# Patient Record
Sex: Male | Born: 1940 | Race: White | Hispanic: No | Marital: Married | Smoking: Current some day smoker
Health system: Southern US, Community
[De-identification: ages and names within clinical notes are randomized; demographics above are authoritative.]

## PROBLEM LIST (undated history)

## (undated) DIAGNOSIS — I10 Essential (primary) hypertension: Secondary | ICD-10-CM

## (undated) DIAGNOSIS — E7800 Pure hypercholesterolemia, unspecified: Secondary | ICD-10-CM

## (undated) DIAGNOSIS — J302 Other seasonal allergic rhinitis: Secondary | ICD-10-CM

## (undated) HISTORY — PX: TONSILLECTOMY: SUR1361

## (undated) HISTORY — DX: Pure hypercholesterolemia, unspecified: E78.00

## (undated) HISTORY — DX: Essential (primary) hypertension: I10

## (undated) HISTORY — DX: Other seasonal allergic rhinitis: J30.2

---

## 2002-11-07 ENCOUNTER — Ambulatory Visit (HOSPITAL_COMMUNITY): Admission: RE | Admit: 2002-11-07 | Discharge: 2002-11-07 | Payer: Self-pay | Admitting: Neurosurgery

## 2003-10-19 HISTORY — PX: WRIST FRACTURE SURGERY: SHX121

## 2014-04-17 ENCOUNTER — Ambulatory Visit (HOSPITAL_COMMUNITY)
Admission: RE | Admit: 2014-04-17 | Discharge: 2014-04-17 | Disposition: A | Discharge status: Home / Self Care | Payer: Medicare Other | Source: Ambulatory Visit | Attending: Internal Medicine | Admitting: Internal Medicine

## 2014-04-17 ENCOUNTER — Encounter: Payer: Self-pay | Admitting: Internal Medicine

## 2014-04-17 ENCOUNTER — Telehealth: Payer: Self-pay | Admitting: Pulmonary Disease

## 2014-04-17 ENCOUNTER — Ambulatory Visit (INDEPENDENT_AMBULATORY_CARE_PROVIDER_SITE_OTHER): Payer: Medicare Other | Admitting: Internal Medicine

## 2014-04-17 VITALS — BP 88/60 | HR 99 | Temp 97.4°F | Ht 70.0 in | Wt 170.0 lb

## 2014-04-17 DIAGNOSIS — I319 Disease of pericardium, unspecified: Secondary | ICD-10-CM | POA: Insufficient documentation

## 2014-04-17 DIAGNOSIS — R0602 Shortness of breath: Secondary | ICD-10-CM

## 2014-04-17 DIAGNOSIS — R599 Enlarged lymph nodes, unspecified: Secondary | ICD-10-CM | POA: Insufficient documentation

## 2014-04-17 DIAGNOSIS — I82290 Acute embolism and thrombosis of other thoracic veins: Secondary | ICD-10-CM | POA: Insufficient documentation

## 2014-04-17 DIAGNOSIS — R918 Other nonspecific abnormal finding of lung field: Secondary | ICD-10-CM

## 2014-04-17 DIAGNOSIS — R222 Localized swelling, mass and lump, trunk: Secondary | ICD-10-CM

## 2014-04-17 DIAGNOSIS — R0609 Other forms of dyspnea: Secondary | ICD-10-CM

## 2014-04-17 DIAGNOSIS — D739 Disease of spleen, unspecified: Secondary | ICD-10-CM

## 2014-04-17 DIAGNOSIS — I959 Hypotension, unspecified: Secondary | ICD-10-CM

## 2014-04-17 DIAGNOSIS — J9819 Other pulmonary collapse: Secondary | ICD-10-CM | POA: Insufficient documentation

## 2014-04-17 DIAGNOSIS — R1110 Vomiting, unspecified: Secondary | ICD-10-CM

## 2014-04-17 DIAGNOSIS — E871 Hypo-osmolality and hyponatremia: Secondary | ICD-10-CM

## 2014-04-17 DIAGNOSIS — R0600 Dyspnea, unspecified: Secondary | ICD-10-CM

## 2014-04-17 DIAGNOSIS — I2510 Atherosclerotic heart disease of native coronary artery without angina pectoris: Secondary | ICD-10-CM

## 2014-04-17 DIAGNOSIS — C3490 Malignant neoplasm of unspecified part of unspecified bronchus or lung: Secondary | ICD-10-CM | POA: Insufficient documentation

## 2014-04-17 DIAGNOSIS — R0989 Other specified symptoms and signs involving the circulatory and respiratory systems: Secondary | ICD-10-CM

## 2014-04-17 LAB — CBC WITH DIFFERENTIAL/PLATELET
Basophils Absolute: 0 10*3/uL (ref 0.0–0.1)
Basophils Relative: 0 % (ref 0–1)
Eosinophils Absolute: 0 10*3/uL (ref 0.0–0.7)
Eosinophils Relative: 0 % (ref 0–5)
HEMATOCRIT: 36.7 % — AB (ref 39.0–52.0)
HEMOGLOBIN: 12.4 g/dL — AB (ref 13.0–17.0)
LYMPHS PCT: 6 % — AB (ref 12–46)
Lymphs Abs: 0.9 10*3/uL (ref 0.7–4.0)
MCH: 31.9 pg (ref 26.0–34.0)
MCHC: 33.8 g/dL (ref 30.0–36.0)
MCV: 94.3 fL (ref 78.0–100.0)
MONO ABS: 0.5 10*3/uL (ref 0.1–1.0)
MONOS PCT: 3 % (ref 3–12)
NEUTROS ABS: 14.9 10*3/uL — AB (ref 1.7–7.7)
Neutrophils Relative %: 91 % — ABNORMAL HIGH (ref 43–77)
Platelets: 483 10*3/uL — ABNORMAL HIGH (ref 150–400)
RBC: 3.89 MIL/uL — ABNORMAL LOW (ref 4.22–5.81)
RDW: 13.8 % (ref 11.5–15.5)
WBC: 16.3 10*3/uL — AB (ref 4.0–10.5)

## 2014-04-17 LAB — BASIC METABOLIC PANEL
ANION GAP: 21 — AB (ref 5–15)
BUN: 37 mg/dL — ABNORMAL HIGH (ref 6–23)
CO2: 19 meq/L (ref 19–32)
Calcium: 9.6 mg/dL (ref 8.4–10.5)
Chloride: 84 mEq/L — ABNORMAL LOW (ref 96–112)
Creatinine, Ser: 1.63 mg/dL — ABNORMAL HIGH (ref 0.50–1.35)
GFR calc Af Amer: 47 mL/min — ABNORMAL LOW (ref >90)
GFR calc non Af Amer: 40 mL/min — ABNORMAL LOW (ref >90)
Glucose, Bld: 201 mg/dL — ABNORMAL HIGH (ref 70–99)
Potassium: 5.4 mEq/L — ABNORMAL HIGH (ref 3.7–5.3)
SODIUM: 124 meq/L — AB (ref 137–147)

## 2014-04-17 LAB — POCT I-STAT CREATININE: CREATININE: 1.8 mg/dL — AB (ref 0.50–1.35)

## 2014-04-17 LAB — PRO B NATRIURETIC PEPTIDE: Pro B Natriuretic peptide (BNP): 1390 pg/mL — ABNORMAL HIGH (ref 0–125)

## 2014-04-17 MED ORDER — IOHEXOL 350 MG/ML SOLN
100.0000 mL | Freq: Once | INTRAVENOUS | Status: AC | PRN
  Administered 2014-04-17: 100 mL via INTRAVENOUS

## 2014-04-17 NOTE — Progress Notes (Signed)
Subjective:    Patient ID: Dillon Willis, male    DOB: 05/01/1941  MRN: [id number]  HPI  33 yowm smoker/ retired truck driver breathing downhill x April 2015 assoc with intermittent hemoptysis eval by Dr Nyra Capes and referred to pulmonary clinic 04/17/2014 with dx of lung mass   04/17/2014 [facility]st [facility]/ Sharice Harriss  Chief Complaint  Patient presents with  . Pulmonary Consult    Referred by Dr. Nyra Capes for lung mass. C/o dyspnea with little activty. Pt's BP 88/60, c/o mild dizziness.   since CT chest 04/12/14 even worse with  Gradual onset  sob to point of room to room and light headed standing while on acei and amlopidine.  No classically pleuritic cp or ex cp, did have low grade hemoptytis sev week prior to OV  But this resolved despite maint on asa one daily   No obvious other patterns in day to day or daytime variabilty or assoc subjective wheeze overt sinus or hb symptoms. No unusual exp hx or h/o childhood pna/ asthma or knowledge of premature birth.  Sleeping ok without nocturnal  or early am exacerbation  of respiratory  c/o's or need for noct saba. Also denies any obvious fluctuation of symptoms with weather or environmental changes or other aggravating or alleviating factors except as outlined above   Current Medications, Allergies, Complete Past Medical History, Past Surgical History, Family History, and Social History were reviewed in [employer] record.            Review of Systems  Constitutional: Negative for fever and unexpected weight change.  HENT: Positive for congestion, postnasal drip, rhinorrhea and sinus pressure. Negative for dental problem, ear pain, nosebleeds, sneezing, sore throat and trouble swallowing.   Eyes: Negative for redness and itching.  Respiratory: Positive for cough, chest tightness and shortness of breath. Negative for wheezing.   Cardiovascular: Positive for chest pain and palpitations. Negative for leg  swelling.  Gastrointestinal: Negative for nausea and vomiting.  Genitourinary: Negative for dysuria.  Musculoskeletal: Negative for joint swelling.  Skin: Negative for rash.  Neurological: Negative for headaches.  Hematological: Does not bruise/bleed easily.  Psychiatric/Behavioral: Negative for dysphoric mood. The patient is not nervous/anxious.        Objective:   Physical Exam  W/c bound elderly wm > state age  Wt Readings from Last 3 Encounters:  04/17/14 170 lb (77.111 kg)      HEENT: nl dentition, turbinates, and orophanx. Nl external ear canals without cough reflex   NECK :  without JVD/Nodes/TM/ nl carotid upstrokes bilaterally   LUNGS: no acc muscle use, clear to A and P bilaterally without cough on insp or exp maneuvers   CV:  RRR  no s3 or murmur or increase in P2, no edema   ABD:  soft and nontender with nl excursion in the supine position. No bruits or organomegaly, bowel sounds nl  MS:  warm without deformities, calf tenderness, cyanosis or clubbing  SKIN: warm and dry without lesions    NEURO:  alert, approp, no deficits    CTa 04/17/2014  1. There is no acute pulmonary embolism. 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 Venous return from the head and upper  thorax is via collaterals. This may have existed previously but was  not apparent due to the noncontrast nature of the previous study.  2. The pericardial effusion has increased and  is now moderate in  size with maximal thickness of 2.6 cm. The small bilateral pleural  effusions have increased.  3. There remains total right upper lobe collapse. There is  subsegmental atelectasis on the left.  4. There is bilateral adrenal enlargement    Recent Labs Lab 04/17/14 1715  NA 124*  K 5.4*  CL 84*  CO2 19  BUN 37*  CREATININE 1.80*  1.63*  GLUCOSE 201*    Recent Labs Lab 04/17/14 1715  HGB 12.4*  HCT  36.7*  WBC 16.3*  PLT 483*     Lab Results  Component Value Date   PROBNP 1390.0* 04/17/2014               Assessment & Plan:

## 2014-04-17 NOTE — Assessment & Plan Note (Signed)
Not really explained by CT in that the effusions are small, the pericardial process is moderate but clearly not tamponading, and the loss of the RUL should not cause any sob x across a room but I suppose the three together are adding up to most of his sob and unfortunately no single fix will help a lot.  I suppose the pericardial process is the most pressing so will consider admit for pericardiocentesis first: this may give a dx and also alleviate some of his symptoms and if not consider fob only as an inpt as the next step.

## 2014-04-17 NOTE — Telephone Encounter (Signed)
Spoke with Dr. Martinique about CT chest results:  Ct Angio Chest Pe W/cm &/or Wo Cm  04/17/2014    CLINICAL DATA:  Severe shortness of breath especially with exertion, hypotension, vomiting. The patient's creatinine is 1.8 and the GFR is 42.   EXAM: CT ANGIOGRAPHY CHEST WITH CONTRAST   TECHNIQUE: Multidetector CT imaging of the chest was performed using the standard protocol during bolus administration of intravenous contrast. Multiplanar CT image reconstructions and MIPs were obtained to evaluate the vascular anatomy.   CONTRAST:  183mL OMNIPAQUE IOHEXOL 350 MG/ML SOLN intravenously.   COMPARISON:  PA chest x-ray dated April 15, 2014 and noncontrast CT scan of chest dated April 12, 2014.   FINDINGS:  Again demonstrated is the large mass in the right paratracheal region extending into the right hilum. This is producing mass effect upon the superior vena cava, and there is thrombus within the superior vena cava with venous return from the right upper extremity coursing through chest wall collaterals and the azygos system to reach the inferior vena cava. No filling defects are demonstrated within the pulmonary arterial tree. The caliber of the thoracic aorta is normal. The contrast bolus does not allow assessment of the presence of a false lumen. The pericardial effusion has increased and is now moderate in size. The cardiac chambers are normal in size. There are coronary artery calcifications. There are enlarged AP window lymph nodes.  There remains total collapse of the right upper lobe. The right middle lobe exhibits at least 1 abnormal nodule which is been previously demonstrated. The right lower lobe is clear. There is subsegmental atelectasis in the left lower lobe. There are small bilateral pleural effusions which have increased in size since the previous study.  Within the upper abdomen there are splenic calcifications consistent with previous granulomatous infection. There is bilateral adrenal  enlargement. The observed portions of the liver are normal. No acute bony abnormalities are demonstrated.  Review of the MIP images confirms the above findings.   IMPRESSION:  1. There is no acute pulmonary embolism. However, there is thrombus within the superior vena cava secondary to mass effect from the large right paratracheal mass. Venous return from the head and upper thorax is via collaterals. This may have existed previously but was not apparent due to the noncontrast nature of the previous study.  2. The pericardial effusion has increased and is now moderate in size with maximal thickness of 2.6 cm. The small bilateral pleural effusions have increased.  3. There remains total right upper lobe collapse. There is subsegmental atelectasis on the left. 4. There is bilateral adrenal enlargement.  5. These results were called by telephone at the time of interpretation on 04/17/2014 at 6:18 PM to Dr. Halford Chessman, who verbally acknowledged these results.    Electronically Signed   By: David  Martinique   On: 04/17/2014 18:21    Results d/w Dr. Melvyn Novas who will inform pt.

## 2014-04-17 NOTE — Assessment & Plan Note (Addendum)
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 Admit 7/2 for pericardiocentesis if cards agrees

## 2014-04-17 NOTE — Assessment & Plan Note (Signed)
Probably mulifactorial with poor po intake, hbp meds, ? Adrenal insuff related to tumor replacement  Will push fluids, stop hbp meds, consider admit 05/07/2014

## 2014-04-17 NOTE — Patient Instructions (Addendum)
Stop benazapril   Please see patient coordinator before you leave today  to schedule CTangiogram today and we will call you with results   Please remember to go to the lab   department downstairs for your tests - we will call you with the results when they are available.

## 2014-04-17 NOTE — Assessment & Plan Note (Addendum)
Assoc with low bp and elevated k so rx check cortisol level am 7/2 and rx  Hydrocortisone empirically

## 2014-04-18 ENCOUNTER — Inpatient Hospital Stay (HOSPITAL_COMMUNITY)
Admission: AD | Admit: 2014-04-18 | Discharge: 2014-05-18 | DRG: 163 | Disposition: E | Payer: Medicare Other | Source: Ambulatory Visit | Attending: Emergency Medicine | Admitting: Emergency Medicine

## 2014-04-18 ENCOUNTER — Inpatient Hospital Stay (HOSPITAL_COMMUNITY): Payer: Medicare Other

## 2014-04-18 ENCOUNTER — Encounter (HOSPITAL_COMMUNITY): Payer: Self-pay

## 2014-04-18 ENCOUNTER — Encounter (HOSPITAL_COMMUNITY): Admission: AD | Disposition: E | Payer: Self-pay | Source: Ambulatory Visit | Attending: Pulmonary Disease

## 2014-04-18 ENCOUNTER — Encounter (HOSPITAL_COMMUNITY): Payer: Medicare Other | Admitting: Certified Registered"

## 2014-04-18 ENCOUNTER — Inpatient Hospital Stay (HOSPITAL_COMMUNITY): Payer: Medicare Other | Admitting: Certified Registered"

## 2014-04-18 DIAGNOSIS — A419 Sepsis, unspecified organism: Secondary | ICD-10-CM | POA: Diagnosis present

## 2014-04-18 DIAGNOSIS — I10 Essential (primary) hypertension: Secondary | ICD-10-CM | POA: Diagnosis present

## 2014-04-18 DIAGNOSIS — R6521 Severe sepsis with septic shock: Secondary | ICD-10-CM

## 2014-04-18 DIAGNOSIS — I871 Compression of vein: Secondary | ICD-10-CM | POA: Diagnosis present

## 2014-04-18 DIAGNOSIS — R0600 Dyspnea, unspecified: Secondary | ICD-10-CM

## 2014-04-18 DIAGNOSIS — R5383 Other fatigue: Secondary | ICD-10-CM

## 2014-04-18 DIAGNOSIS — C3490 Malignant neoplasm of unspecified part of unspecified bronchus or lung: Secondary | ICD-10-CM | POA: Diagnosis present

## 2014-04-18 DIAGNOSIS — R7309 Other abnormal glucose: Secondary | ICD-10-CM | POA: Diagnosis present

## 2014-04-18 DIAGNOSIS — E43 Unspecified severe protein-calorie malnutrition: Secondary | ICD-10-CM | POA: Diagnosis present

## 2014-04-18 DIAGNOSIS — N179 Acute kidney failure, unspecified: Secondary | ICD-10-CM | POA: Diagnosis present

## 2014-04-18 DIAGNOSIS — R000 Tachycardia, unspecified: Secondary | ICD-10-CM | POA: Diagnosis present

## 2014-04-18 DIAGNOSIS — J189 Pneumonia, unspecified organism: Secondary | ICD-10-CM | POA: Diagnosis present

## 2014-04-18 DIAGNOSIS — J9600 Acute respiratory failure, unspecified whether with hypoxia or hypercapnia: Secondary | ICD-10-CM | POA: Diagnosis not present

## 2014-04-18 DIAGNOSIS — Z800 Family history of malignant neoplasm of digestive organs: Secondary | ICD-10-CM

## 2014-04-18 DIAGNOSIS — I3139 Other pericardial effusion (noninflammatory): Secondary | ICD-10-CM | POA: Diagnosis present

## 2014-04-18 DIAGNOSIS — R0609 Other forms of dyspnea: Secondary | ICD-10-CM

## 2014-04-18 DIAGNOSIS — E7800 Pure hypercholesterolemia, unspecified: Secondary | ICD-10-CM | POA: Diagnosis present

## 2014-04-18 DIAGNOSIS — R570 Cardiogenic shock: Secondary | ICD-10-CM | POA: Diagnosis present

## 2014-04-18 DIAGNOSIS — E871 Hypo-osmolality and hyponatremia: Secondary | ICD-10-CM | POA: Diagnosis present

## 2014-04-18 DIAGNOSIS — I4892 Unspecified atrial flutter: Secondary | ICD-10-CM | POA: Diagnosis present

## 2014-04-18 DIAGNOSIS — D72829 Elevated white blood cell count, unspecified: Secondary | ICD-10-CM | POA: Diagnosis present

## 2014-04-18 DIAGNOSIS — I4891 Unspecified atrial fibrillation: Secondary | ICD-10-CM | POA: Diagnosis present

## 2014-04-18 DIAGNOSIS — C7949 Secondary malignant neoplasm of other parts of nervous system: Secondary | ICD-10-CM

## 2014-04-18 DIAGNOSIS — Z87891 Personal history of nicotine dependence: Secondary | ICD-10-CM | POA: Diagnosis not present

## 2014-04-18 DIAGNOSIS — E875 Hyperkalemia: Secondary | ICD-10-CM | POA: Diagnosis present

## 2014-04-18 DIAGNOSIS — C3410 Malignant neoplasm of upper lobe, unspecified bronchus or lung: Secondary | ICD-10-CM

## 2014-04-18 DIAGNOSIS — I959 Hypotension, unspecified: Secondary | ICD-10-CM

## 2014-04-18 DIAGNOSIS — R0989 Other specified symptoms and signs involving the circulatory and respiratory systems: Secondary | ICD-10-CM

## 2014-04-18 DIAGNOSIS — C7931 Secondary malignant neoplasm of brain: Secondary | ICD-10-CM | POA: Diagnosis present

## 2014-04-18 DIAGNOSIS — C7970 Secondary malignant neoplasm of unspecified adrenal gland: Secondary | ICD-10-CM | POA: Diagnosis present

## 2014-04-18 DIAGNOSIS — J3489 Other specified disorders of nose and nasal sinuses: Secondary | ICD-10-CM | POA: Diagnosis present

## 2014-04-18 DIAGNOSIS — R222 Localized swelling, mass and lump, trunk: Secondary | ICD-10-CM

## 2014-04-18 DIAGNOSIS — Z79899 Other long term (current) drug therapy: Secondary | ICD-10-CM

## 2014-04-18 DIAGNOSIS — I82290 Acute embolism and thrombosis of other thoracic veins: Secondary | ICD-10-CM | POA: Diagnosis present

## 2014-04-18 DIAGNOSIS — R5381 Other malaise: Secondary | ICD-10-CM | POA: Diagnosis present

## 2014-04-18 DIAGNOSIS — R34 Anuria and oliguria: Secondary | ICD-10-CM | POA: Diagnosis present

## 2014-04-18 DIAGNOSIS — J9819 Other pulmonary collapse: Secondary | ICD-10-CM | POA: Diagnosis present

## 2014-04-18 DIAGNOSIS — I314 Cardiac tamponade: Secondary | ICD-10-CM | POA: Diagnosis present

## 2014-04-18 DIAGNOSIS — I480 Paroxysmal atrial fibrillation: Secondary | ICD-10-CM

## 2014-04-18 DIAGNOSIS — Z8249 Family history of ischemic heart disease and other diseases of the circulatory system: Secondary | ICD-10-CM

## 2014-04-18 DIAGNOSIS — I319 Disease of pericardium, unspecified: Secondary | ICD-10-CM | POA: Diagnosis present

## 2014-04-18 DIAGNOSIS — R918 Other nonspecific abnormal finding of lung field: Secondary | ICD-10-CM

## 2014-04-18 DIAGNOSIS — R6520 Severe sepsis without septic shock: Secondary | ICD-10-CM | POA: Diagnosis present

## 2014-04-18 DIAGNOSIS — I4819 Other persistent atrial fibrillation: Secondary | ICD-10-CM

## 2014-04-18 DIAGNOSIS — R0602 Shortness of breath: Secondary | ICD-10-CM | POA: Diagnosis present

## 2014-04-18 DIAGNOSIS — C3491 Malignant neoplasm of unspecified part of right bronchus or lung: Secondary | ICD-10-CM

## 2014-04-18 DIAGNOSIS — I313 Pericardial effusion (noninflammatory): Secondary | ICD-10-CM

## 2014-04-18 HISTORY — PX: INTRAOPERATIVE TRANSESOPHAGEAL ECHOCARDIOGRAM: SHX5062

## 2014-04-18 HISTORY — PX: SUBXYPHOID PERICARDIAL WINDOW: SHX5075

## 2014-04-18 HISTORY — PX: VIDEO BRONCHOSCOPY: SHX5072

## 2014-04-18 LAB — CBC WITH DIFFERENTIAL/PLATELET
BASOS PCT: 0 % (ref 0–1)
Basophils Absolute: 0 10*3/uL (ref 0.0–0.1)
EOS PCT: 0 % (ref 0–5)
Eosinophils Absolute: 0 10*3/uL (ref 0.0–0.7)
HEMATOCRIT: 37 % — AB (ref 39.0–52.0)
HEMOGLOBIN: 12.9 g/dL — AB (ref 13.0–17.0)
LYMPHS ABS: 1.6 10*3/uL (ref 0.7–4.0)
Lymphocytes Relative: 7 % — ABNORMAL LOW (ref 12–46)
MCH: 32.7 pg (ref 26.0–34.0)
MCHC: 34.9 g/dL (ref 30.0–36.0)
MCV: 93.7 fL (ref 78.0–100.0)
MONO ABS: 0.9 10*3/uL (ref 0.1–1.0)
Monocytes Relative: 4 % (ref 3–12)
NEUTROS ABS: 20 10*3/uL — AB (ref 1.7–7.7)
Neutrophils Relative %: 89 % — ABNORMAL HIGH (ref 43–77)
Platelets: 456 10*3/uL — ABNORMAL HIGH (ref 150–400)
RBC: 3.95 MIL/uL — AB (ref 4.22–5.81)
RDW: 13.8 % (ref 11.5–15.5)
WBC: 22.5 10*3/uL — ABNORMAL HIGH (ref 4.0–10.5)

## 2014-04-18 LAB — GLUCOSE, CAPILLARY
GLUCOSE-CAPILLARY: 152 mg/dL — AB (ref 70–99)
Glucose-Capillary: 150 mg/dL — ABNORMAL HIGH (ref 70–99)
Glucose-Capillary: 162 mg/dL — ABNORMAL HIGH (ref 70–99)
Glucose-Capillary: 148 mg/dL — ABNORMAL HIGH (ref 70–99)

## 2014-04-18 LAB — COMPREHENSIVE METABOLIC PANEL
ALT: 15 U/L (ref 0–53)
AST: 19 U/L (ref 0–37)
Albumin: 3.4 g/dL — ABNORMAL LOW (ref 3.5–5.2)
Alkaline Phosphatase: 104 U/L (ref 39–117)
Anion gap: 21 — ABNORMAL HIGH (ref 5–15)
BUN: 43 mg/dL — ABNORMAL HIGH (ref 6–23)
CALCIUM: 9.9 mg/dL (ref 8.4–10.5)
CO2: 17 meq/L — AB (ref 19–32)
Chloride: 84 mEq/L — ABNORMAL LOW (ref 96–112)
Creatinine, Ser: 1.38 mg/dL — ABNORMAL HIGH (ref 0.50–1.35)
GFR, EST AFRICAN AMERICAN: 57 mL/min — AB (ref >90)
GFR, EST NON AFRICAN AMERICAN: 50 mL/min — AB (ref >90)
GLUCOSE: 132 mg/dL — AB (ref 70–99)
Potassium: 5 mEq/L (ref 3.7–5.3)
Sodium: 122 mEq/L — ABNORMAL LOW (ref 137–147)
Total Bilirubin: 0.7 mg/dL (ref 0.3–1.2)
Total Protein: 7.1 g/dL (ref 6.0–8.3)

## 2014-04-18 LAB — MRSA PCR SCREENING: MRSA by PCR: NEGATIVE

## 2014-04-18 LAB — MAGNESIUM: Magnesium: 2.6 mg/dL — ABNORMAL HIGH (ref 1.5–2.5)

## 2014-04-18 LAB — PRO B NATRIURETIC PEPTIDE: Pro B Natriuretic peptide (BNP): 1408 pg/mL — ABNORMAL HIGH (ref 0–125)

## 2014-04-18 LAB — TSH: TSH: 4.42 u[IU]/mL (ref 0.350–4.500)

## 2014-04-18 LAB — PHOSPHORUS: Phosphorus: 4.7 mg/dL — ABNORMAL HIGH (ref 2.3–4.6)

## 2014-04-18 SURGERY — BRONCHOSCOPY, VIDEO-ASSISTED
Anesthesia: General

## 2014-04-18 SURGERY — VIDEO BRONCHOSCOPY WITHOUT FLUORO
Anesthesia: Moderate Sedation | Laterality: Bilateral

## 2014-04-18 MED ORDER — PHENYLEPHRINE HCL 0.25 % NA SOLN
NASAL | Status: DC | PRN
  Administered 2014-04-18: 2 via NASAL

## 2014-04-18 MED ORDER — HYDROMORPHONE HCL PF 1 MG/ML IJ SOLN
0.2500 mg | INTRAMUSCULAR | Status: DC | PRN
Start: 2014-04-18 — End: 2014-04-18
  Administered 2014-04-18 (×2): 0.5 mg via INTRAVENOUS

## 2014-04-18 MED ORDER — PROPRANOLOL HCL 1 MG/ML IV SOLN
1.0000 mg | Freq: Once | INTRAVENOUS | Status: AC
  Administered 2014-04-18 (×2): 0.25 mg via INTRAVENOUS

## 2014-04-18 MED ORDER — HEPARIN SODIUM (PORCINE) 5000 UNIT/ML IJ SOLN
5000.0000 [IU] | Freq: Three times a day (TID) | INTRAMUSCULAR | Status: DC
  Filled 2014-04-18 (×2): qty 1

## 2014-04-18 MED ORDER — PHENYLEPHRINE HCL 10 MG/ML IJ SOLN
10.0000 mg | INTRAVENOUS | Status: DC | PRN
  Administered 2014-04-18: 40 ug/min via INTRAVENOUS

## 2014-04-18 MED ORDER — MIDAZOLAM HCL 10 MG/2ML IJ SOLN: 1.0000 mg | Freq: Once | INTRAMUSCULAR | Status: DC

## 2014-04-18 MED ORDER — MEPERIDINE HCL 100 MG/ML IJ SOLN: 100.0000 mg | Freq: Once | INTRAMUSCULAR | Status: DC

## 2014-04-18 MED ORDER — 0.9 % SODIUM CHLORIDE (POUR BTL) OPTIME
TOPICAL | Status: DC | PRN
  Administered 2014-04-18: 2000 mL

## 2014-04-18 MED ORDER — NEOSTIGMINE METHYLSULFATE 10 MG/10ML IV SOLN
INTRAVENOUS | Status: DC | PRN
  Administered 2014-04-18: 3 mg via INTRAVENOUS

## 2014-04-18 MED ORDER — ETOMIDATE 2 MG/ML IV SOLN
INTRAVENOUS | Status: DC | PRN
  Administered 2014-04-18: 4 mg via INTRAVENOUS
  Administered 2014-04-18: 16 mg via INTRAVENOUS

## 2014-04-18 MED ORDER — HYDROCORTISONE NA SUCCINATE PF 100 MG IJ SOLR
100.0000 mg | Freq: Four times a day (QID) | INTRAMUSCULAR | Status: DC
  Administered 2014-04-18: 100 mg via INTRAVENOUS
  Filled 2014-04-18 (×3): qty 2

## 2014-04-18 MED ORDER — AMIODARONE HCL IN DEXTROSE 360-4.14 MG/200ML-% IV SOLN
30.0000 mg/h | INTRAVENOUS | Status: DC
  Filled 2014-04-18 (×2): qty 200

## 2014-04-18 MED ORDER — PHENYLEPHRINE HCL 0.25 % NA SOLN
1.0000 | Freq: Four times a day (QID) | NASAL | Status: DC | PRN
Start: 2014-04-18 — End: 2014-04-18

## 2014-04-18 MED ORDER — PANTOPRAZOLE SODIUM 40 MG PO TBEC
40.0000 mg | DELAYED_RELEASE_TABLET | Freq: Every day | ORAL | Status: DC
  Administered 2014-04-19: 40 mg via ORAL
  Filled 2014-04-18: qty 1

## 2014-04-18 MED ORDER — ACETAMINOPHEN 160 MG/5ML PO SOLN
1000.0000 mg | Freq: Four times a day (QID) | ORAL | Status: AC
  Filled 2014-04-18: qty 40

## 2014-04-18 MED ORDER — SENNOSIDES-DOCUSATE SODIUM 8.6-50 MG PO TABS
1.0000 | ORAL_TABLET | Freq: Every day | ORAL | Status: DC
  Administered 2014-04-19 – 2014-04-27 (×4): 1 via ORAL
  Filled 2014-04-18 (×12): qty 1

## 2014-04-18 MED ORDER — POTASSIUM CHLORIDE 10 MEQ/50ML IV SOLN
10.0000 meq | Freq: Every day | INTRAVENOUS | Status: DC | PRN
  Filled 2014-04-18: qty 50

## 2014-04-18 MED ORDER — INSULIN ASPART 100 UNIT/ML ~~LOC~~ SOLN
1.0000 [IU] | SUBCUTANEOUS | Status: DC
  Administered 2014-04-18 (×2): 1 [IU] via SUBCUTANEOUS
  Administered 2014-04-19 (×2): 2 [IU] via SUBCUTANEOUS
  Administered 2014-04-19 – 2014-04-20 (×4): 1 [IU] via SUBCUTANEOUS

## 2014-04-18 MED ORDER — LIDOCAINE HCL 2 % EX GEL: 1.0000 "application " | Freq: Once | CUTANEOUS | Status: DC

## 2014-04-18 MED ORDER — DILTIAZEM HCL 100 MG IV SOLR
5.0000 mg/h | INTRAVENOUS | Status: DC
  Administered 2014-04-18: 5 mg/h via INTRAVENOUS
  Filled 2014-04-18: qty 100

## 2014-04-18 MED ORDER — DEXTROSE-NACL 5-0.9 % IV SOLN
INTRAVENOUS | Status: DC
  Administered 2014-04-18 – 2014-04-21 (×3): via INTRAVENOUS
  Administered 2014-04-27: 50 mL via INTRAVENOUS
  Administered 2014-04-28: 1000 mL via INTRAVENOUS

## 2014-04-18 MED ORDER — AMIODARONE HCL IN DEXTROSE 360-4.14 MG/200ML-% IV SOLN
60.0000 mg/h | INTRAVENOUS | Status: AC
  Administered 2014-04-18 – 2014-04-19 (×2): 60 mg/h via INTRAVENOUS
  Filled 2014-04-18 (×2): qty 200

## 2014-04-18 MED ORDER — FENTANYL CITRATE 0.05 MG/ML IJ SOLN
25.0000 ug | INTRAMUSCULAR | Status: DC | PRN
  Administered 2014-04-20: 25 ug via INTRAVENOUS
  Administered 2014-04-29 (×2): 50 ug via INTRAVENOUS
  Filled 2014-04-18 (×3): qty 2

## 2014-04-18 MED ORDER — GLYCOPYRROLATE 0.2 MG/ML IJ SOLN
INTRAMUSCULAR | Status: DC | PRN
  Administered 2014-04-18: 0.4 mg via INTRAVENOUS

## 2014-04-18 MED ORDER — HYDROMORPHONE HCL PF 1 MG/ML IJ SOLN
INTRAMUSCULAR | Status: AC
  Filled 2014-04-18: qty 1

## 2014-04-18 MED ORDER — PANTOPRAZOLE SODIUM 40 MG IV SOLR
40.0000 mg | Freq: Every day | INTRAVENOUS | Status: DC
  Filled 2014-04-18: qty 40

## 2014-04-18 MED ORDER — BISACODYL 5 MG PO TBEC
10.0000 mg | DELAYED_RELEASE_TABLET | Freq: Every day | ORAL | Status: DC
Start: 2014-04-18 — End: 2014-04-29
  Administered 2014-04-19 – 2014-04-26 (×5): 10 mg via ORAL
  Filled 2014-04-18 (×7): qty 2

## 2014-04-18 MED ORDER — ACETAMINOPHEN 500 MG PO TABS
1000.0000 mg | ORAL_TABLET | Freq: Four times a day (QID) | ORAL | Status: AC
  Administered 2014-04-19 – 2014-04-23 (×10): 1000 mg via ORAL
  Filled 2014-04-18 (×17): qty 2

## 2014-04-18 MED ORDER — OXYCODONE HCL 5 MG PO TABS
5.0000 mg | ORAL_TABLET | ORAL | Status: DC | PRN
  Administered 2014-04-28: 10 mg via ORAL
  Filled 2014-04-18: qty 2

## 2014-04-18 MED ORDER — PROMETHAZINE HCL 25 MG/ML IJ SOLN: 6.2500 mg | INTRAMUSCULAR | Status: DC | PRN

## 2014-04-18 MED ORDER — SIMVASTATIN 20 MG PO TABS
20.0000 mg | ORAL_TABLET | Freq: Every evening | ORAL | Status: DC
  Administered 2014-04-19 – 2014-04-20 (×2): 20 mg via ORAL
  Filled 2014-04-18 (×4): qty 1

## 2014-04-18 MED ORDER — OXYCODONE HCL 5 MG PO TABS: 5.0000 mg | ORAL_TABLET | Freq: Once | ORAL | Status: DC | PRN

## 2014-04-18 MED ORDER — PROPRANOLOL HCL 1 MG/ML IV SOLN
INTRAVENOUS | Status: AC
  Administered 2014-04-18: 0.25 mg via INTRAVENOUS
  Filled 2014-04-18: qty 1

## 2014-04-18 MED ORDER — LIDOCAINE HCL 2 % EX GEL
CUTANEOUS | Status: DC | PRN
  Administered 2014-04-18: 1

## 2014-04-18 MED ORDER — FENTANYL CITRATE 0.05 MG/ML IJ SOLN
INTRAMUSCULAR | Status: DC | PRN
  Administered 2014-04-18: 150 ug via INTRAVENOUS

## 2014-04-18 MED ORDER — OXYCODONE HCL 5 MG/5ML PO SOLN: 5.0000 mg | Freq: Once | ORAL | Status: DC | PRN

## 2014-04-18 MED ORDER — ONDANSETRON HCL 4 MG/2ML IJ SOLN: 4.0000 mg | Freq: Four times a day (QID) | INTRAMUSCULAR | Status: DC | PRN

## 2014-04-18 MED ORDER — TRAMADOL HCL 50 MG PO TABS
50.0000 mg | ORAL_TABLET | Freq: Four times a day (QID) | ORAL | Status: DC | PRN
  Administered 2014-04-24 – 2014-04-25 (×2): 50 mg via ORAL
  Filled 2014-04-18 (×2): qty 1

## 2014-04-18 MED ORDER — MONTELUKAST SODIUM 10 MG PO TABS
10.0000 mg | ORAL_TABLET | Freq: Every morning | ORAL | Status: DC
  Administered 2014-04-19 – 2014-04-29 (×11): 10 mg via ORAL
  Filled 2014-04-18 (×12): qty 1

## 2014-04-18 MED ORDER — DEXTROSE 5 % IV SOLN
1.5000 g | INTRAVENOUS | Status: AC
  Administered 2014-04-18: 1.5 g via INTRAVENOUS
  Filled 2014-04-18: qty 1.5

## 2014-04-18 MED ORDER — ROCURONIUM BROMIDE 100 MG/10ML IV SOLN
INTRAVENOUS | Status: DC | PRN
  Administered 2014-04-18: 10 mg via INTRAVENOUS
  Administered 2014-04-18: 40 mg via INTRAVENOUS

## 2014-04-18 MED ORDER — LACTATED RINGERS IV SOLN
INTRAVENOUS | Status: DC | PRN
  Administered 2014-04-18: 16:00:00 via INTRAVENOUS

## 2014-04-18 MED ORDER — ONDANSETRON HCL 4 MG/2ML IJ SOLN
INTRAMUSCULAR | Status: DC | PRN
  Administered 2014-04-18: 4 mg via INTRAVENOUS

## 2014-04-18 MED ORDER — LIDOCAINE HCL (CARDIAC) 20 MG/ML IV SOLN
INTRAVENOUS | Status: DC | PRN
  Administered 2014-04-18: 100 mg via INTRAVENOUS

## 2014-04-18 MED ORDER — SODIUM CHLORIDE 0.9 % IV SOLN: 250.0000 mL | INTRAVENOUS | Status: DC | PRN

## 2014-04-18 MED ORDER — SODIUM CHLORIDE 0.9 % IV SOLN
INTRAVENOUS | Status: DC
  Administered 2014-04-18 (×2): via INTRAVENOUS

## 2014-04-18 MED ORDER — MIDAZOLAM HCL 5 MG/5ML IJ SOLN
INTRAMUSCULAR | Status: DC | PRN
  Administered 2014-04-18: 1 mg via INTRAVENOUS

## 2014-04-18 MED ORDER — DEXTROSE 5 % IV SOLN
1.5000 g | Freq: Three times a day (TID) | INTRAVENOUS | Status: AC
  Administered 2014-04-19 (×2): 1.5 g via INTRAVENOUS
  Filled 2014-04-18 (×2): qty 1.5

## 2014-04-18 MED ORDER — HYDROCORTISONE NA SUCCINATE PF 100 MG IJ SOLR
80.0000 mg | Freq: Two times a day (BID) | INTRAMUSCULAR | Status: DC
  Administered 2014-04-18 – 2014-04-19 (×2): 80 mg via INTRAVENOUS
  Filled 2014-04-18 (×3): qty 1.6

## 2014-04-18 SURGICAL SUPPLY — 48 items
ATTRACTOMAT 16X20 MAGNETIC DRP (DRAPES) ×3 IMPLANT
BENZOIN TINCTURE PRP APPL 2/3 (GAUZE/BANDAGES/DRESSINGS) ×3 IMPLANT
CANISTER SUCTION 2500CC (MISCELLANEOUS) ×3 IMPLANT
CATH THORACIC 28FR (CATHETERS) IMPLANT
CATH THORACIC 28FR RT ANG (CATHETERS) IMPLANT
CATH THORACIC 36FR (CATHETERS) IMPLANT
CATH THORACIC 36FR RT ANG (CATHETERS) IMPLANT
CONT SPEC 4OZ CLIKSEAL STRL BL (MISCELLANEOUS) IMPLANT
COVER SURGICAL LIGHT HANDLE (MISCELLANEOUS) ×6 IMPLANT
DERMABOND ADVANCED (GAUZE/BANDAGES/DRESSINGS) ×1
DERMABOND ADVANCED .7 DNX12 (GAUZE/BANDAGES/DRESSINGS) ×2 IMPLANT
DRAIN CHANNEL 28F RND 3/8 FF (WOUND CARE) ×3 IMPLANT
DRAPE LAPAROSCOPIC ABDOMINAL (DRAPES) ×3 IMPLANT
DRAPE PROXIMA HALF (DRAPES) ×3 IMPLANT
ELECT REM PT RETURN 9FT ADLT (ELECTROSURGICAL) ×3
ELECTRODE REM PT RTRN 9FT ADLT (ELECTROSURGICAL) ×2 IMPLANT
FORCEPS RADIAL JAW LRG 4 PULM (INSTRUMENTS) ×2 IMPLANT
GLOVE BIO SURGEON STRL SZ7.5 (GLOVE) ×6 IMPLANT
GLOVE BIOGEL PI IND STRL 7.0 (GLOVE) ×2 IMPLANT
GLOVE BIOGEL PI INDICATOR 7.0 (GLOVE) ×1
HEMOSTAT POWDER SURGIFOAM 1G (HEMOSTASIS) IMPLANT
KIT BASIN OR (CUSTOM PROCEDURE TRAY) ×3 IMPLANT
KIT ROOM TURNOVER OR (KITS) ×3 IMPLANT
NS IRRIG 1000ML POUR BTL (IV SOLUTION) ×3 IMPLANT
OIL SILICONE PENTAX (PARTS (SERVICE/REPAIRS)) ×3 IMPLANT
PACK CHEST (CUSTOM PROCEDURE TRAY) ×3 IMPLANT
PAD ARMBOARD 7.5X6 YLW CONV (MISCELLANEOUS) ×6 IMPLANT
PAD ELECT DEFIB RADIOL ZOLL (MISCELLANEOUS) ×3 IMPLANT
RADIAL JAW LRG 4 PULMONARY (INSTRUMENTS) ×1
SPONGE GAUZE 4X4 12PLY (GAUZE/BANDAGES/DRESSINGS) ×3 IMPLANT
STRIP CLOSURE SKIN 1/2X4 (GAUZE/BANDAGES/DRESSINGS) ×3 IMPLANT
SUT SILK 2 0 SH CR/8 (SUTURE) ×3 IMPLANT
SUT VIC AB 1 CTX 18 (SUTURE) ×3 IMPLANT
SUT VIC AB 1 CTX 36 (SUTURE) ×1
SUT VIC AB 1 CTX36XBRD ANBCTR (SUTURE) ×2 IMPLANT
SUT VIC AB 3-0 X1 27 (SUTURE) ×3 IMPLANT
SWAB COLLECTION DEVICE MRSA (MISCELLANEOUS) ×3 IMPLANT
SYR 50ML SLIP (SYRINGE) ×3 IMPLANT
SYRINGE 10CC LL (SYRINGE) IMPLANT
SYSTEM SAHARA CHEST DRAIN ATS (WOUND CARE) ×3 IMPLANT
TAPE CLOTH SURG 4X10 WHT LF (GAUZE/BANDAGES/DRESSINGS) ×3 IMPLANT
TOWEL OR 17X24 6PK STRL BLUE (TOWEL DISPOSABLE) ×3 IMPLANT
TOWEL OR 17X26 10 PK STRL BLUE (TOWEL DISPOSABLE) ×9 IMPLANT
TRAP SPECIMEN MUCOUS 40CC (MISCELLANEOUS) ×9 IMPLANT
TRAY FOLEY CATH 14FRSI W/METER (CATHETERS) ×3 IMPLANT
TRAY FOLEY IC TEMP SENS 14FR (CATHETERS) ×3 IMPLANT
TUBE ANAEROBIC SPECIMEN COL (MISCELLANEOUS) ×3 IMPLANT
WATER STERILE IRR 1000ML POUR (IV SOLUTION) ×6 IMPLANT

## 2014-04-18 NOTE — Anesthesia Procedure Notes (Addendum)
Procedure Name: Intubation Date/Time: 04/25/2014 4:42 PM Performed by: Melina Copa, Nadir Vasques R Pre-anesthesia Checklist: Patient identified, Emergency Drugs available, Suction available, Patient being monitored and Timeout performed Patient Re-evaluated:Patient Re-evaluated prior to inductionOxygen Delivery Method: Circle system utilized Preoxygenation: Pre-oxygenation with 100% oxygen Intubation Type: IV induction Ventilation: Mask ventilation without difficulty Laryngoscope Size: Mac and 4 Grade View: Grade II Tube type: Oral Tube size: 8.5 mm Number of attempts: 1 Airway Equipment and Method: Stylet Placement Confirmation: ETT inserted through vocal cords under direct vision,  positive ETCO2 and breath sounds checked- equal and bilateral Secured at: 22 cm Tube secured with: Tape Dental Injury: Teeth and Oropharynx as per pre-operative assessment     The patient was identified and consent obtained.  TO was performed, and full barrier precautions were used.  The skin was anesthetized with lidocaine.  Once the vein was located with the 22 ga., the wire was inserted into the vein. 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 Darcey Nora we decided not to proceed.  Earnest Bailey, MD

## 2014-04-18 NOTE — Anesthesia Preprocedure Evaluation (Addendum)
Anesthesia Evaluation  Patient identified by MRN, date of birth, ID band Patient awake    Reviewed: Allergy & Precautions, H&P , NPO status , Patient's Chart, lab work & pertinent test results  Airway Mallampati: II TM Distance: >3 FB Neck ROM: Limited    Dental  (+) Edentulous Upper, Dental Advisory Given   Pulmonary Current Smoker,  + rhonchi         Cardiovascular hypertension,     Neuro/Psych    GI/Hepatic negative GI ROS, Neg liver ROS,   Endo/Other  negative endocrine ROS  Renal/GU negative Renal ROS     Musculoskeletal   Abdominal   Peds  Hematology   Anesthesia Other Findings   Reproductive/Obstetrics                          Anesthesia Physical Anesthesia Plan  ASA: III  Anesthesia Plan: General   Post-op Pain Management:    Induction: Intravenous  Airway Management Planned: Oral ETT  Additional Equipment: Arterial line and CVP  Intra-op Plan:   Post-operative Plan: Extubation in OR  Informed Consent: I have reviewed the patients History and Physical, chart, labs and discussed the procedure including the risks, benefits and alternatives for the proposed anesthesia with the patient or authorized representative who has indicated his/her understanding and acceptance.   Dental advisory given  Plan Discussed with: CRNA, Anesthesiologist and Surgeon  Anesthesia Plan Comments:        Anesthesia Quick Evaluation

## 2014-04-18 NOTE — Progress Notes (Signed)
[facility]  Stat echo today shows tamponade physiology. Dillon Willis is comfortable right now, he had just made it to the bronch suite, but not administered any sedation. Currently his vital signs are stable and he is breathing comfortably.   However, we need to deal with the tamponade now.  Cardiothoracic surgery consulted Plan to transfer to Dillon Willis now Maintain NPO  Patient updated at length, son updated.  CC time by me 45 minutes.  [name], MD [facility] Pager: [phone] Cell: [phone] If no response, call [phone]

## 2014-04-18 NOTE — Progress Notes (Signed)
Brought pt down for bronchoscopy. Timeout performed & began prepping pt for procedure. 0.25% Neosynephrine sprayed twice in each nare & 2% lidocaine jelly 2 ml once down each nare. Dr Lake Bells came in & said procedure is canceled. Pt has fluid built up around his heart & needs emergency surgery. Pt will be transported back to ICU at this time.   Kathie Dike RRT

## 2014-04-18 NOTE — Anesthesia Postprocedure Evaluation (Signed)
  Anesthesia Post-op Note  Patient: Dillon Willis  Procedure(s) Performed: Procedure(s): INTRAOPERATIVE TRANSESOPHAGEAL ECHOCARDIOGRAM (N/A) VIDEO BRONCHOSCOPY SUBXYPHOID PERICARDIAL WINDOW (N/A)  Patient Location: PACU  Anesthesia Type:General  Level of Consciousness: awake and alert   Airway and Oxygen Therapy: Patient Spontanous Breathing  Post-op Pain: mild  Post-op Assessment: Post-op Vital signs reviewed, Patient's Cardiovascular Status Stable and Respiratory Function Stable  Post-op Vital Signs: stable  Last Vitals:  Filed Vitals:   05/05/2014 1815  BP: 148/86  Pulse: 131  Temp:   Resp: 28    Complications: No apparent anesthesia complications

## 2014-04-18 NOTE — Progress Notes (Signed)
Echocardiogram Echocardiogram Transesophageal has been performed.  Dillon Willis 05/08/2014, 5:10 PM

## 2014-04-18 NOTE — Progress Notes (Signed)
Echo Lab  2D Echocardiogram completed.  Chivon Lepage L Lacretia Tindall, RDCS 04/22/2014 10:50 AM

## 2014-04-18 NOTE — Progress Notes (Signed)
Day of Surgery Procedure(s) (LRB): INTRAOPERATIVE TRANSESOPHAGEAL ECHOCARDIOGRAM (N/A) VIDEO BRONCHOSCOPY SUBXYPHOID PERICARDIAL WINDOW (N/A) Subjective: Patient examined and CT , echocardiogram reviewed He has tamponade from prob lung cancer with RUL collapse and paratrecheal mass, SVC syndrome Thrombus in SVC- will start heparin later postop bilaterlal adrenal masses  Objective: Vital signs in last 24 hours: Temp:  [98.4 F (36.9 C)-98.6 F (37 C)] 98.6 F (37 C) (07/02 1200) Pulse Rate:  [93-102] 93 (07/02 1400) Cardiac Rhythm:  [-] Normal sinus rhythm (07/02 1100) Resp:  [16-26] 20 (07/02 1400) BP: (91-117)/(58-78) 103/76 mmHg (07/02 1400) SpO2:  [95 %-98 %] 95 % (07/02 1400) Weight:  [166 lb 7.2 oz (75.5 kg)] 166 lb 7.2 oz (75.5 kg) (07/02 1015)  Hemodynamic parameters for last 24 hours:   Tachy, low BP Intake/Output from previous day:   Intake/Output this shift: Total I/O In: 636.7 [I.V.:636.7] Out: -   Elderly inbed Neck veins enlarged Lungs clear Rapid HR w/o murmur Neuro intact Lab Results:  Recent Labs  04/17/14 1715 04/17/2014 1030  WBC 16.3* 22.5*  HGB 12.4* 12.9*  HCT 36.7* 37.0*  PLT 483* 456*   BMET:  Recent Labs  04/17/14 1715 04/28/2014 1030  NA 124* 122*  K 5.4* 5.0  CL 84* 84*  CO2 19 17*  GLUCOSE 201* 132*  BUN 37* 43*  CREATININE 1.80*  1.63* 1.38*  CALCIUM 9.6 9.9    PT/INR: No results found for this basename: LABPROT, INR,  in the last 72 hours ABG No results found for this basename: phart, pco2, po2, hco3, tco2, acidbasedef, o2sat   CBG (last 3)   Recent Labs  05/05/2014 1201  GLUCAP 150*    Assessment/Plan: S/P Procedure(s) (LRB): INTRAOPERATIVE TRANSESOPHAGEAL ECHOCARDIOGRAM (N/A) VIDEO BRONCHOSCOPY SUBXYPHOID PERICARDIAL WINDOW (N/A) Proceed with bronch, window Discussed procedure with patient and son- benefits and risks   LOS: 0 days    VAN TRIGT III,Jermeka Schlotterbeck 05/08/2014

## 2014-04-18 NOTE — Progress Notes (Signed)
HR after 3rd dose inderal 0.25mg  is now 110's 120's , sbp 106/69 by cuff / 122/62 aline. Notified dr Orene Desanctis, will d/c

## 2014-04-18 NOTE — Transfer of Care (Signed)
Immediate Anesthesia Transfer of Care Note  Patient: Dillon Willis  Procedure(s) Performed: Procedure(s): INTRAOPERATIVE TRANSESOPHAGEAL ECHOCARDIOGRAM (N/A) VIDEO BRONCHOSCOPY SUBXYPHOID PERICARDIAL WINDOW (N/A)  Patient Location: PACU  Anesthesia Type:General  Level of Consciousness: awake  Airway & Oxygen Therapy: Patient Spontanous Breathing and Patient connected to nasal cannula oxygen  Post-op Assessment: Report given to PACU RN, Post -op Vital signs reviewed and stable and Patient moving all extremities  Post vital signs: Reviewed and stable  Complications: No apparent anesthesia complications

## 2014-04-18 NOTE — H&P (Signed)
PULMONARY / CRITICAL CARE MEDICINE   Name: Dillon Willis MRN: [id number] DOB: 08/17/1941    ADMISSION DATE:  05/06/2014  PRIMARY SERVICE: PCCM  PCP : Dr. Nyra Capes   CHIEF COMPLAINT:  Severe shortness of breath and dizziness   BRIEF PATIENT DESCRIPTION: 73 yowm smoker referred to office 04/17/14 for intermittent hemoptysis  And RUL lung mass complicated by moderate pericardial effusion and hyponatremia and hypotension.    SIGNIFICANT EVENTS / STUDIES:  CTA chest 04/17/14 >neg PE, thrombus within the superior vena cava secondary to mass effect from the large right paratracheal mass. Venous return from the head and upper  thorax is via collaterals.  The pericardial effusion has increased and is now moderate in  size with maximal thickness of 2.6 cm. The small bilateral pleural effusions have increased.  total right upper lobe collapse. There is subsegmental atelectasis on the left. . There is bilateral adrenal enlargement 2D Echo07/08/2014 >>> Serum Cortisol 04/24/2014 >>>   LINES / TUBES: None   CULTURES: None   ANTIBIOTICS:   HISTORY OF PRESENT ILLNESS:   04/17/2014 1st [facility] office visit/ Dillon Willis  Chief Complaint   Patient presents with   .  Pulmonary Consult     Referred by Dr. Nyra Capes for lung mass. C/o dyspnea with little activty. Pt's BP 88/60, c/o mild dizziness.   since CT chest 04/12/14 even worse with Gradual onset sob to point of room to room and light headed standing while on acei and amlopidine. No classically pleuritic cp or ex cp, did have low grade hemoptytis sev week prior to OV But this resolved despite maint on asa one daily  No obvious other patterns in day to day or daytime variabilty or assoc subjective wheeze overt sinus or hb symptoms. No unusual exp hx or h/o childhood pna/ asthma or knowledge of premature birth.  Sleeping ok without nocturnal or early am exacerbation of respiratory c/o's or need for noct saba. Also denies any obvious fluctuation of symptoms with  weather or environmental changes or other aggravating or alleviating factors except as outlined above   Pt was sent for CTA chest to r/o PE . CTA showed CTa 04/17/2014 . There is no acute pulmonary embolism. However, there is thrombus within the superior vena cava secondary to mass effect from the large right paratracheal mass. Venous return from the head and upper thorax is via collaterals. This may have existed previously but was not apparent due to the noncontrast nature of the previous study. The pericardial effusion has increased and is now moderate in size with maximal thickness of 2.6 cm. The small bilateral pleural effusions have increased.  There remains total right upper lobe collapse. There is subsegmental atelectasis on the left.  There is bilateral adrenal enlargement  His b/p meds were stopped d/t hypotension and dizziness. He will require admission for IVF and further evaluation w/ 2 D echo and Bronchoscopy.      PAST MEDICAL HISTORY :  Past Medical History  Diagnosis Date  . Hypertension   . Hypercholesteremia   . Seasonal allergies    Past Surgical History  Procedure Laterality Date  . Wrist fracture surgery  2005   Prior to Admission medications   Medication Sig Start Date End Date Taking? Authorizing Provider  montelukast (SINGULAIR) 10 MG tablet Take 10 mg by mouth at bedtime.    Historical Provider, MD  simvastatin (ZOCOR) 20 MG tablet Take 20 mg by mouth daily.    Historical Provider, MD   No Known  Allergies  FAMILY HISTORY:  Family History  Problem Relation Age of Onset  . Throat cancer Brother    SOCIAL HISTORY:  reports that he has been smoking Cigars and Cigarettes.  He has a 43.5 pack-year smoking history. He has quit using smokeless tobacco. His smokeless tobacco use included Chew. He reports that he drinks alcohol. He reports that he does not use illicit drugs.  REVIEW OF SYSTEMS:   Constitutional: Negative for fever and unexpected weight change.  HENT:  Positive for congestion, postnasal drip, rhinorrhea and sinus pressure. Negative for dental problem, ear pain, nosebleeds, sneezing, sore throat and trouble swallowing.  Eyes: Negative for redness and itching.  Respiratory: Positive for cough, chest tightness and shortness of breath. Negative for wheezing.  Cardiovascular: Positive for chest pain and palpitations. Negative for leg swelling.  Gastrointestinal: Negative for nausea and vomiting.  Genitourinary: Negative for dysuria.  Musculoskeletal: Negative for joint swelling.  Skin: Negative for rash.  Neurological: Negative for headaches.  Hematological: Does not bruise/bleed easily.  Psychiatric/Behavioral: Negative for dysphoric mood. The patient is not nervous/anxious.   SUBJECTIVE:   VITAL SIGNS: Temp:  [97.4 F (36.3 C)-98.6 F (37 C)] 98.6 F (37 C) (07/02 1200) Pulse Rate:  [94-102] 94 (07/02 1200) Resp:  [16-26] 16 (07/02 1200) BP: (88-117)/(58-78) 103/77 mmHg (07/02 1200) SpO2:  [96 %-98 %] 96 % (07/02 1200) Weight:  [166 lb 7.2 oz (75.5 kg)-170 lb (77.111 kg)] 166 lb 7.2 oz (75.5 kg) (07/02 1015) HEMODYNAMICS:   VENTILATOR SETTINGS:   INTAKE / OUTPUT: Intake/Output     07/01 0701 - 07/02 0700 07/02 0701 - 07/03 0700   I.V. (mL/kg)  236.7 (3.1)   Total Intake(mL/kg)  236.7 (3.1)   Net   +236.7          PHYSICAL EXAMINATION: W/c bound elderly wm > state age  HEENT: nl dentition, turbinates, and orophanx. Nl external ear canals without cough reflex  NECK : without JVD/Nodes/TM/ nl carotid upstrokes bilaterally  LUNGS: no acc muscle use, clear to A and P bilaterally without cough on insp or exp maneuvers  CV: RRR no s3 or murmur or increase in P2, no edema  ABD: soft and nontender with nl excursion in the supine position. No bruits or organomegaly, bowel sounds nl  MS: warm without deformities, calf tenderness, cyanosis or clubbing  SKIN: warm and dry without lesions  NEURO: alert, approp, no  deficits  LABS:  CBC  Recent Labs Lab 04/17/14 1715 04/23/2014 1030  WBC 16.3* 22.5*  HGB 12.4* 12.9*  HCT 36.7* 37.0*  PLT 483* 456*   Coag's No results found for this basename: APTT, INR,  in the last 168 hours BMET  Recent Labs Lab 04/17/14 1715 05/02/2014 1030  NA 124* 122*  K 5.4* 5.0  CL 84* 84*  CO2 19 17*  BUN 37* 43*  CREATININE 1.80*  1.63* 1.38*  GLUCOSE 201* 132*   Electrolytes  Recent Labs Lab 04/17/14 1715 05/02/2014 1030  CALCIUM 9.6 9.9  MG  --  2.6*  PHOS  --  4.7*   Sepsis Markers No results found for this basename: LATICACIDVEN, PROCALCITON, O2SATVEN,  in the last 168 hours ABG No results found for this basename: PHART, PCO2ART, PO2ART,  in the last 168 hours Liver Enzymes  Recent Labs Lab 04/22/2014 1030  AST 19  ALT 15  ALKPHOS 104  BILITOT 0.7  ALBUMIN 3.4*   Cardiac Enzymes  Recent Labs Lab 04/17/14 1715 05/03/2014 1030  PROBNP 1390.0* 1408.0*  Glucose  Recent Labs Lab 05/15/2014 1201  GLUCAP 150*    Imaging Ct Angio Chest Pe W/cm &/or Wo Cm  04/17/2014   CLINICAL DATA:  Severe shortness of breath especially with exertion, hypotension, vomiting. The patient's creatinine is 1.8 and the GFR is 42.  EXAM: CT ANGIOGRAPHY CHEST WITH CONTRAST  TECHNIQUE: Multidetector CT imaging of the chest was performed using the standard protocol during bolus administration of intravenous contrast. Multiplanar CT image reconstructions and MIPs were obtained to evaluate the vascular anatomy.  CONTRAST:  114mL OMNIPAQUE IOHEXOL 350 MG/ML SOLN intravenously.  COMPARISON:  PA chest x-ray dated April 15, 2014 and noncontrast CT scan of chest dated April 12, 2014.  FINDINGS: Again demonstrated is the large mass in the right paratracheal region extending into the right hilum. This is producing mass effect upon the superior vena cava, and there is thrombus within the superior vena cava with venous return from the right upper extremity coursing through chest  wall collaterals and the azygos system to reach the inferior vena cava. No filling defects are demonstrated within the pulmonary arterial tree. The caliber of the thoracic aorta is normal. The contrast bolus does not allow assessment of the presence of a false lumen. The pericardial effusion has increased and is now moderate in size. The cardiac chambers are normal in size. There are coronary artery calcifications. There are enlarged AP window lymph nodes.  There remains total collapse of the right upper lobe. The right middle lobe exhibits at least 1 abnormal nodule which is been previously demonstrated. The right lower lobe is clear. There is subsegmental atelectasis in the left lower lobe. There are small bilateral pleural effusions which have increased in size since the previous study.  Within the upper abdomen there are splenic calcifications consistent with previous granulomatous infection. There is bilateral adrenal enlargement. The observed portions of the liver are normal. No acute bony abnormalities are demonstrated.  Review of the MIP images confirms the above findings.  IMPRESSION: 1. There is no acute pulmonary embolism. However, there is thrombus within the superior vena cava secondary to mass effect from the large right paratracheal mass. Venous return from the head and upper thorax is via collaterals. This may have existed previously but was not apparent due to the noncontrast nature of the previous study. 2. The pericardial effusion has increased and is now moderate in size with maximal thickness of 2.6 cm. The small bilateral pleural effusions have increased. 3. There remains total right upper lobe collapse. There is subsegmental atelectasis on the left. 4. There is bilateral adrenal enlargement. 5. These results were called by telephone at the time of interpretation on 04/17/2014 at 6:18 PM to Dr. Halford Chessman, who verbally acknowledged these results.   Electronically Signed   By: David  Martinique   On:  04/17/2014 18:21     CXR:    ASSESSMENT / PLAN:  PULMONARY A: 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 P: Plan  For Bronchoscopy today unless tamponade by echo    CARDIOVASCULAR A: Moderate Pericardial Effusion >consider pericardiocentesis after echo  Hypotension >HTN rx stopped 7/1 (norvasc/ACEI )  P:  Stat 2 D echo  Monitor b/p closely  IVF w/ NS at 200cc /hr    RENAL A:  Hyponatremia  Hyperkalemia  Renal Insufficiency  - note adrenal enlargement on CT ? Insufficiency ? P:  Begin NS IVF 200cc /hr  Monitor electrolytes closely   GASTROINTESTINAL A:   P:  NPO  PPI for GI proph  HEMATOLOGIC A:   P:  Labs pending   INFECTIOUS A:   P:   Monitor wbc/temp tr   ENDOCRINE A:  Hyperglycemia  ?Adrenal Insufficiency r/t tumor replacement -begin hydrocortisone empirically after cortisol level done   P:   Check Cortisol level  Begin Hydrocortisone 100mg  every 6hr  SSI   NEUROLOGIC A:  Intact  P:   Monitor closely with electrolytes imbalances   TODAY'S SUMMARY: 73 yo male admitted with a large right RUL mass and pericardial effusion need to decide whether to do fob vs address pericardial dz first       Christinia Gully NP-C  [facility] [place] Pager: [phone]  05/06/2014, 12:15 PM

## 2014-04-18 NOTE — Brief Op Note (Signed)
05/12/2014  5:47 PM  PATIENT:  Dillon Willis  73 y.o. male  PRE-OPERATIVE DIAGNOSIS:  PERICARDIAL EFFUSION WITH TAMPONADE  POST-OPERATIVE DIAGNOSIS:  PERICARDIAL EFFUSION WITH TAMPONADE  PROCEDURE:  VIDEO BRONCHOSCOPY, INTRAOPERATIVE TRANSESOPHAGEAL ECHOCARDIOGRAM, SUBXIPHOID PERICARDIAL WINDOW   FINDINGS: over 500 cc of pericardial fluid removed  SURGEON:  Surgeon(s) and Role: Panel 1:    * Ivin Poot, MD - Primary  Panel 2:    * Ivin Poot, MD - Primary  PHYSICIAN ASSISTANT: Lars Pinks PA-C  ANESTHESIA:   general  EBL:  Total I/O In: 736.7 [I.V.:736.7] Out: 150 [Urine:100; Blood:50]  BLOOD ADMINISTERED:none  DRAINS: 32 French chest tube placed in the pericardial space   SPECIMEN:  Source of Specimen:  Pericardial biopsies;pericardial fluid  DISPOSITION OF SPECIMEN:  Pathology, cytology, cultures  COUNTS CORRECT:  YES  DICTATION: .Dragon Dictation  PLAN OF CARE: Admit to inpatient   PATIENT DISPOSITION:  PACU - hemodynamically stable.   Delay start of Pharmacological VTE agent (>24hrs) due to surgical blood loss or risk of bleeding: yes

## 2014-04-19 ENCOUNTER — Encounter (HOSPITAL_COMMUNITY): Payer: Self-pay | Admitting: Internal Medicine

## 2014-04-19 ENCOUNTER — Inpatient Hospital Stay (HOSPITAL_COMMUNITY): Payer: Medicare Other

## 2014-04-19 DIAGNOSIS — I871 Compression of vein: Secondary | ICD-10-CM

## 2014-04-19 DIAGNOSIS — I319 Disease of pericardium, unspecified: Secondary | ICD-10-CM

## 2014-04-19 LAB — CORTISOL: Cortisol, Plasma: 109.9 ug/dL

## 2014-04-19 LAB — CBC
HEMATOCRIT: 35.1 % — AB (ref 39.0–52.0)
Hemoglobin: 11.8 g/dL — ABNORMAL LOW (ref 13.0–17.0)
MCH: 31.2 pg (ref 26.0–34.0)
MCHC: 33.6 g/dL (ref 30.0–36.0)
MCV: 92.9 fL (ref 78.0–100.0)
Platelets: 340 10*3/uL (ref 150–400)
RBC: 3.78 MIL/uL — AB (ref 4.22–5.81)
RDW: 13.8 % (ref 11.5–15.5)
WBC: 19.7 10*3/uL — ABNORMAL HIGH (ref 4.0–10.5)

## 2014-04-19 LAB — BASIC METABOLIC PANEL
Anion gap: 15 (ref 5–15)
BUN: 36 mg/dL — AB (ref 6–23)
CO2: 21 meq/L (ref 19–32)
CREATININE: 0.88 mg/dL (ref 0.50–1.35)
Calcium: 8.6 mg/dL (ref 8.4–10.5)
Chloride: 91 mEq/L — ABNORMAL LOW (ref 96–112)
GFR calc non Af Amer: 84 mL/min — ABNORMAL LOW (ref >90)
Glucose, Bld: 161 mg/dL — ABNORMAL HIGH (ref 70–99)
Potassium: 4.9 mEq/L (ref 3.7–5.3)
Sodium: 127 mEq/L — ABNORMAL LOW (ref 137–147)

## 2014-04-19 LAB — GLUCOSE, CAPILLARY
GLUCOSE-CAPILLARY: 152 mg/dL — AB (ref 70–99)
GLUCOSE-CAPILLARY: 99 mg/dL (ref 70–99)
Glucose-Capillary: 129 mg/dL — ABNORMAL HIGH (ref 70–99)
Glucose-Capillary: 59 mg/dL — ABNORMAL LOW (ref 70–99)
Glucose-Capillary: 148 mg/dL — ABNORMAL HIGH (ref 70–99)
Glucose-Capillary: 109 mg/dL — ABNORMAL HIGH (ref 70–99)

## 2014-04-19 LAB — HEPARIN LEVEL (UNFRACTIONATED): Heparin Unfractionated: <0.1 IU/mL — ABNORMAL LOW (ref 0.30–0.70)

## 2014-04-19 MED ORDER — AMIODARONE HCL 200 MG PO TABS
400.0000 mg | ORAL_TABLET | Freq: Two times a day (BID) | ORAL | Status: DC
  Administered 2014-04-19 – 2014-04-20 (×3): 400 mg via ORAL
  Filled 2014-04-19 (×4): qty 2

## 2014-04-19 MED ORDER — HEPARIN (PORCINE) IN NACL 100-0.45 UNIT/ML-% IJ SOLN
1700.0000 [IU]/h | INTRAMUSCULAR | Status: DC
  Administered 2014-04-19: 1250 [IU]/h via INTRAVENOUS
  Administered 2014-04-20: 1400 [IU]/h via INTRAVENOUS
  Filled 2014-04-19 (×4): qty 250

## 2014-04-19 NOTE — Progress Notes (Signed)
[facility] BRIEF SICU PROGRESS NOTE  1 Day Post-Op  S/P Procedure(s) (LRB): INTRAOPERATIVE TRANSESOPHAGEAL ECHOCARDIOGRAM (N/A) VIDEO BRONCHOSCOPY SUBXYPHOID PERICARDIAL WINDOW (N/A)   Stable day Overall feels much better NSR w/ stable BP O2 sats 100% on RA Minimal chest tube output  Plan: Continue current plan  Dillon Willis H 04/19/2014 3:58 PM

## 2014-04-19 NOTE — Progress Notes (Signed)
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   Medical History: Past Medical History  Diagnosis Date  . Hypertension   . Hypercholesteremia   . Seasonal allergies     Medications:  Prescriptions prior to admission  Medication Sig Dispense Refill  . cholecalciferol (VITAMIN D) 1000 UNITS tablet Take 1,000 Units by mouth daily.      . montelukast (SINGULAIR) 10 MG tablet Take 10 mg by mouth every morning.       . simvastatin (ZOCOR) 20 MG tablet Take 20 mg by mouth every evening.         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  He is s/p video bronch and biopsy of RUL mass and subxiphoid pericardial window for drainage of effusion 7/2 PM.  Over 500 ml of pericardial fluid removed.   7/1 CTA chest: thrombus within the superior vena cava 2ndary to mass effect from large RUL mass  Goal of Therapy:  Heparin level 0.3-0.7 units/ml Monitor platelets by anticoagulation protocol: Yes   Plan:  -start heparin drip with no bolus (per MD request) at 1250 units/hr and check 8 hr HL -daily HL and CBC while on heparin  Eudelia Bunch, Pharm.D. [phone] 04/19/2014 8:32 AM

## 2014-04-19 NOTE — Op Note (Signed)
NAMEMarland Kitchen  ILIR, MAHRT NO.:  [id number]  MEDICAL RECORD NO.:  [id number]  LOCATION:  2S14C                        FACILITY:  [facility]  PHYSICIAN:  Ivin Poot, M.D.  DATE OF BIRTH:  1941-04-22  DATE OF PROCEDURE:  05/07/2014 DATE OF DISCHARGE:                              OPERATIVE REPORT   OPERATION: 1. Video bronchoscopy and biopsy of right upper lobe mass. 2. Subxiphoid pericardial window for drainage of effusion.  PREOPERATIVE DIAGNOSES:  Right upper lobe mass, paratracheal mass, large pericardial effusion with tamponade.  POSTOPERATIVE DIAGNOSES:  Right upper lobe mass, paratracheal mass, large pericardial effusion with tamponade.  SURGEON:  Ivin Poot, M.D.  ASSISTANT:  Lars Pinks, PA.  ANESTHESIA:  General.  INDICATIONS:  The patient is a 73 year old, smoker, who presented to the hospital with shortness of breath, weakness, and was found to have a large right upper lobe mass with peritracheal mass on CT scan.  This also demonstrated a large pericardial effusion, which was documented by echocardiogram, which showed evidence of tamponade.  The patient was transferred to this hospital for urgent pericardial window.  I discussed the situation with the patient and his son.  I discussed the role of pericardial window for treating his pericardial effusion and tamponade.  I also discussed video bronchoscopy with biopsy to obtain material to help establish a pathologic diagnosis of presumed advanced stage lung cancer.  I discussed the procedure in detail including location of the surgical incision, use of general anesthesia, and expected postoperative recovery.  I discussed with the patient the risks of bleeding, recurrent effusion, progressive ventilator dependence, and death.  After reviewing these issues, the patient demonstrated his understanding and agreed to proceed with surgery under what I felt was an informed consent.  OPERATIVE  PROCEDURE:  The patient was brought to the operating room and placed supine on the operating room table where general anesthesia was induced.  A proper time-out was performed.  A video scope was passed down the ET tube.  The left mainstem bronchus and left upper lobe and left lower lobe endobronchial segments were examined and found to be without abnormality.  The bronchoscope was passed on the right mainstem bronchus.  The right lower lobe and middle lobe from the bronchus intermediate were normal. The right upper lobe bronchus was completely occluded with a mass which was very fleshy.  This was biopsied several times with the biopsy forceps and this was sent for pathology.  There was some bleeding, this was treated with irrigation.  The bronchoscope was removed.  The patient was then repositioned for the pericardial window.  The chest and upper abdomen were prepped and draped as a sterile field.  A small incision was made centered over the xiphoid.  The xiphoid was excised and the posterior rectal sheath was incised.  The sternal elevating retractor was placed in the soft tissue anterior to the pericardium was carefully dissected away.  An incision was made in the anterior pericardium and medially 5-600 mL of clear fluid drained.  A window of 3 cm of pericardial tissue was then excised and sent for pathology.  The surface of the heart was examined and was edematous  and erythematous.  There was no evidence of discrete nodules.  After completely draining the effusion, a right-angle 28-French chest tube was placed dependently and brought out through a small incision.  After the fluid was drained, the blood pressure of the patient improved significantly.  Incision was closed in layers using interrupted Vicryl for the deep fascia.  The subcutaneous and skin were closed with a running Vicryl. Sterile dressings were applied.  The patient was extubated and returned to recovery  room.     Ivin Poot, M.D.     PV/MEDQ  D:  05/12/2014  T:  04/19/2014  Job:  [id number]

## 2014-04-19 NOTE — Progress Notes (Signed)
ANTICOAGULATION CONSULT NOTE - Follow Up Consult  Pharmacy Consult for heparin Indication: clot in the SVC and right atrium secondary to malignancy   No Known Allergies  Patient Measurements: Height: 5\' 10"  (177.8 cm) Weight: 170 lb 3.1 oz (77.2 kg) IBW/kg (Calculated) : 73   Vital Signs: Temp: 97.8 F (36.6 C) (07/03 2000) Temp src: Oral (07/03 2000) BP: 98/58 mmHg (07/03 2100) Pulse Rate: 75 (07/03 2100)  Labs:  Recent Labs  04/17/14 1715 05/11/2014 1030 04/19/14 0410 04/19/14 1904  HGB 12.4* 12.9* 11.8*  --   HCT 36.7* 37.0* 35.1*  --   PLT 483* 456* 340  --   HEPARINUNFRC  --   --   --  <0.10*  CREATININE 1.80*  1.63* 1.38* 0.88  --     Estimated Creatinine Clearance: 78.3 ml/min (by C-G formula based on Cr of 0.88).   Medical History: Past Medical History  Diagnosis Date  . Hypertension   . Hypercholesteremia   . Seasonal allergies     Medications:  Prescriptions prior to admission  Medication Sig Dispense Refill  . cholecalciferol (VITAMIN D) 1000 UNITS tablet Take 1,000 Units by mouth daily.      . montelukast (SINGULAIR) 10 MG tablet Take 10 mg by mouth every morning.       . simvastatin (ZOCOR) 20 MG tablet Take 20 mg by mouth every evening.         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  He is s/p video bronch and biopsy of RUL mass and subxiphoid pericardial window for drainage of effusion 7/2 PM.  Over 500 ml of pericardial fluid removed.   7/1 CTA chest: thrombus within the superior vena cava 2ndary to mass effect from large RUL mass Heparin drip 1250 uts/hr HL < 0.1 - per RN iv infusing without problems  Goal of Therapy:  Heparin level 0.3-0.7 units/ml Monitor platelets by anticoagulation protocol: Yes   Plan:  -Increase Heparin drip 1400 uts/hr  Check HL with AML  Bonnita Nasuti Pharm.D. CPP, BCPS Clinical Pharmacist [phone] 04/19/2014 9:25  PM

## 2014-04-19 NOTE — Progress Notes (Signed)
PULMONARY / CRITICAL CARE MEDICINE   Name: Dillon Willis MRN: [id number] DOB: [birth year]    ADMISSION DATE:  05/17/2014  PRIMARY SERVICE: PCCM  PCP : Dr. Nyra Capes   CHIEF COMPLAINT:  Severe shortness of breath and dizziness   BRIEF PATIENT DESCRIPTION: 6 yowm smoker referred to office 04/17/14 for intermittent hemoptysis  And RUL lung mass complicated by moderate pericardial effusion and hyponatremia and hypotension.    SIGNIFICANT EVENTS / STUDIES:  CTA chest 04/17/14 >neg PE, thrombus within the superior vena cava secondary to mass effect from the large right paratracheal mass. Venous return from the head and upper  thorax is via collaterals.  The pericardial effusion has increased and is now moderate in  size with maximal thickness of 2.6 cm. The small bilateral pleural effusions have increased.  total right upper lobe collapse. There is subsegmental atelectasis on the left. . There is bilateral adrenal enlargement 2D Echo07/29/2015 >>> tamponade. 7/2 emergent pericardial window and bronchoscopy with RUL mass biopsy by Dr. Nils Pyle 2D Echo post  Window > no more tamponade, RA clot, SVC clot, LVEF 50-55%   LINES / TUBES: 7/2 Sub xyphoid pericardial tube >>  CULTURES: 7/2 pericardium >>  ANTIBIOTICS: 7/2 periop cefuroxime >>  SUBJECTIVE: pericardial window yesterday, extubated and doing well Afib with RVR last night, amiodarone started  VITAL SIGNS: Temp:  [97 F (36.1 C)-98.6 F (37 C)] 97.6 F (36.4 C) (07/03 0735) Pulse Rate:  [35-134] 74 (07/03 0900) Resp:  [12-28] 23 (07/03 0900) BP: (82-148)/(58-86) 97/59 mmHg (07/03 0900) SpO2:  [95 %-100 %] 100 % (07/03 0900) Arterial Line BP: (90-176)/(49-82) 139/73 mmHg (07/03 0900) Weight:  [75.5 kg (166 lb 7.2 oz)-77.2 kg (170 lb 3.1 oz)] 77.2 kg (170 lb 3.1 oz) (07/03 0630) HEMODYNAMICS:   VENTILATOR SETTINGS:   INTAKE / OUTPUT: Intake/Output     07/02 0701 - 07/03 0700 07/03 0701 - 07/04 0700   P.O. 100    I.V. (mL/kg)  3047.3 (39.5) 26.7 (0.3)   IV Piggyback 50    Total Intake(mL/kg) 3197.3 (41.4) 26.7 (0.3)   Urine (mL/kg/hr) 850 30 (0.2)   Blood 50    Chest Tube 60    Total Output 960 30   Net +2237.3 -3.3          PHYSICAL EXAMINATION:  Gen: walking around unit, no acute distress HEENT: NCAT, EOMi, OP clear,  PULM: CTA B CV: RRR, no mgr, no JVD, pericardial drain in place AB: BS+, soft, nontender, no hsm Ext: warm, no edema, no clubbing, no cyanosis Neuro: A&Ox4, MAEW   LABS:  CBC  Recent Labs Lab 04/17/14 1715 05/17/2014 1030 04/19/14 0410  WBC 16.3* 22.5* 19.7*  HGB 12.4* 12.9* 11.8*  HCT 36.7* 37.0* 35.1*  PLT 483* 456* 340   Coag's No results found for this basename: APTT, INR,  in the last 168 hours BMET  Recent Labs Lab 04/17/14 1715 05/04/2014 1030 04/19/14 0410  NA 124* 122* 127*  K 5.4* 5.0 4.9  CL 84* 84* 91*  CO2 19 17* 21  BUN 37* 43* 36*  CREATININE 1.80*  1.63* 1.38* 0.88  GLUCOSE 201* 132* 161*   Electrolytes  Recent Labs Lab 04/17/14 1715 04/26/2014 1030 04/19/14 0410  CALCIUM 9.6 9.9 8.6  MG  --  2.6*  --   PHOS  --  4.7*  --    Sepsis Markers No results found for this basename: LATICACIDVEN, PROCALCITON, O2SATVEN,  in the last 168 hours ABG No results found for  this basename: PHART, PCO2ART, PO2ART,  in the last 168 hours Liver Enzymes  Recent Labs Lab 05/13/2014 1030  AST 19  ALT 15  ALKPHOS 104  BILITOT 0.7  ALBUMIN 3.4*   Cardiac Enzymes  Recent Labs Lab 04/17/14 1715 05/15/2014 1030  PROBNP 1390.0* 1408.0*   Glucose  Recent Labs Lab 05/12/2014 1201 05/17/2014 1827 04/23/2014 2003 04/27/2014 2348 04/19/14 0335 04/19/14 0757  GLUCAP 150* 162* 148* 152* 152* 129*    Imaging    7/3 CXR:  Pericardial drain noted, RUL  Mass/collapse  ASSESSMENT / PLAN:  PULMONARY A: 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 >S/p endobronchial biopsy on 7/3 P: F/u biopsy report Anticoagulation  for SVC syndrome Once we know diagnosis will need staging imaging prior to XRT vs chemo   CARDIOVASCULAR A: Tamponade resolved post pericardial window Hypotension >HTN rx stopped 7/1 (norvasc/ACEI )  Afib with RVR > back to sinus rhythm with amiodarone P:  Per CT surgery F/u path from pericardial biopsy D/c stress dose steroids Amiodarone to PO today  RENAL A:  Hyponatremia  Hyperkalemia  AKI improving P:  KVO fluids Monitor electrolytes closely   GASTROINTESTINAL A:  No acute issues P:   Diet as tolerated D/c PPI for GI proph  HEMATOLOGIC A:  Leukocytosis > stress reaction + hydrocortisone P:  Monitor CBC  INFECTIOUS A:  No acute infectious issues P:   Monitor wbc/temp tr   ENDOCRINE A:  Hyperglycemia  P:   Stop Hydrocortisone 100mg  every 6hr  SSI   NEUROLOGIC A:  Intact  P:   Monitor closely with electrolytes imbalances   SDU tomorrow  [name], MD [place] [facility] Pager: [phone] Cell: [phone] If no response, call [phone]  04/19/2014, 9:22 AM

## 2014-04-20 ENCOUNTER — Encounter (HOSPITAL_COMMUNITY): Payer: Self-pay | Admitting: Cardiology

## 2014-04-20 ENCOUNTER — Inpatient Hospital Stay (HOSPITAL_COMMUNITY): Payer: Medicare Other

## 2014-04-20 DIAGNOSIS — I4891 Unspecified atrial fibrillation: Secondary | ICD-10-CM

## 2014-04-20 LAB — GLUCOSE, CAPILLARY
Glucose-Capillary: 84 mg/dL (ref 70–99)
Glucose-Capillary: 118 mg/dL — ABNORMAL HIGH (ref 70–99)
Glucose-Capillary: 126 mg/dL — ABNORMAL HIGH (ref 70–99)
Glucose-Capillary: 119 mg/dL — ABNORMAL HIGH (ref 70–99)
Glucose-Capillary: 154 mg/dL — ABNORMAL HIGH (ref 70–99)
Glucose-Capillary: 131 mg/dL — ABNORMAL HIGH (ref 70–99)

## 2014-04-20 LAB — HEPARIN LEVEL (UNFRACTIONATED)
Heparin Unfractionated: <0.1 IU/mL — ABNORMAL LOW (ref 0.30–0.70)
Heparin Unfractionated: 0.1 IU/mL — ABNORMAL LOW (ref 0.30–0.70)
Heparin Unfractionated: 0.55 IU/mL (ref 0.30–0.70)

## 2014-04-20 LAB — BLOOD GAS, ARTERIAL
Acid-base deficit: 1.3 mmol/L (ref 0.0–2.0)
Bicarbonate: 22 mEq/L (ref 20.0–24.0)
DRAWN BY: 313941
FIO2: 0.21 %
O2 SAT: 96.4 %
PO2 ART: 66.5 mmHg — AB (ref 80.0–100.0)
Patient temperature: 97.9
TCO2: 23 mmol/L (ref 0–100)
pCO2 arterial: 30.7 mmHg — ABNORMAL LOW (ref 35.0–45.0)
pH, Arterial: 7.468 — ABNORMAL HIGH (ref 7.350–7.450)

## 2014-04-20 LAB — COMPREHENSIVE METABOLIC PANEL
ALBUMIN: 2.5 g/dL — AB (ref 3.5–5.2)
ALK PHOS: 93 U/L (ref 39–117)
ALT: 15 U/L (ref 0–53)
AST: 16 U/L (ref 0–37)
Anion gap: 14 (ref 5–15)
BUN: 24 mg/dL — ABNORMAL HIGH (ref 6–23)
CHLORIDE: 95 meq/L — AB (ref 96–112)
CO2: 21 mEq/L (ref 19–32)
CREATININE: 0.8 mg/dL (ref 0.50–1.35)
Calcium: 8.7 mg/dL (ref 8.4–10.5)
GFR calc Af Amer: >90 mL/min (ref >90)
GFR calc non Af Amer: 87 mL/min — ABNORMAL LOW (ref >90)
Glucose, Bld: 107 mg/dL — ABNORMAL HIGH (ref 70–99)
POTASSIUM: 4.7 meq/L (ref 3.7–5.3)
SODIUM: 130 meq/L — AB (ref 137–147)
Total Bilirubin: 0.7 mg/dL (ref 0.3–1.2)
Total Protein: 6 g/dL (ref 6.0–8.3)

## 2014-04-20 LAB — CBC
HCT: 36.5 % — ABNORMAL LOW (ref 39.0–52.0)
Hemoglobin: 12.6 g/dL — ABNORMAL LOW (ref 13.0–17.0)
MCH: 32.2 pg (ref 26.0–34.0)
MCHC: 34.5 g/dL (ref 30.0–36.0)
MCV: 93.4 fL (ref 78.0–100.0)
Platelets: 342 10*3/uL (ref 150–400)
RBC: 3.91 MIL/uL — ABNORMAL LOW (ref 4.22–5.81)
RDW: 14.1 % (ref 11.5–15.5)
WBC: 20.5 10*3/uL — AB (ref 4.0–10.5)

## 2014-04-20 MED ORDER — METOPROLOL TARTRATE 1 MG/ML IV SOLN
5.0000 mg | Freq: Once | INTRAVENOUS | Status: AC
  Administered 2014-04-20: 5 mg via INTRAVENOUS
  Filled 2014-04-20: qty 5

## 2014-04-20 MED ORDER — AMIODARONE HCL IN DEXTROSE 360-4.14 MG/200ML-% IV SOLN
60.0000 mg/h | INTRAVENOUS | Status: DC
  Administered 2014-04-21 – 2014-04-22 (×2): 30 mg/h via INTRAVENOUS
  Filled 2014-04-20 (×9): qty 200

## 2014-04-20 MED ORDER — AMIODARONE LOAD VIA INFUSION
150.0000 mg | Freq: Once | INTRAVENOUS | Status: AC
  Administered 2014-04-20: 150 mg via INTRAVENOUS
  Filled 2014-04-20: qty 83.34

## 2014-04-20 MED ORDER — HEPARIN (PORCINE) IN NACL 100-0.45 UNIT/ML-% IJ SOLN
1900.0000 [IU]/h | INTRAMUSCULAR | Status: DC
  Administered 2014-04-20 – 2014-04-23 (×6): 1900 [IU]/h via INTRAVENOUS
  Filled 2014-04-20 (×9): qty 250

## 2014-04-20 MED ORDER — AMIODARONE HCL IN DEXTROSE 360-4.14 MG/200ML-% IV SOLN
60.0000 mg/h | INTRAVENOUS | Status: AC
  Administered 2014-04-20 (×2): 60 mg/h via INTRAVENOUS
  Filled 2014-04-20 (×2): qty 200

## 2014-04-20 MED ORDER — INSULIN ASPART 100 UNIT/ML ~~LOC~~ SOLN
0.0000 [IU] | Freq: Three times a day (TID) | SUBCUTANEOUS | Status: DC
  Administered 2014-04-21 – 2014-04-23 (×3): 2 [IU] via SUBCUTANEOUS

## 2014-04-20 NOTE — Progress Notes (Signed)
[facility]  Rapid Afib again, asymptomatic, BP stable  Stop oral amio Restart IV amio with bolus May need cardiology consult if still a problem tomorrow.  [name], MD [place] [facility] Pager: [phone] Cell: [phone] If no response, call [phone]

## 2014-04-20 NOTE — Consult Note (Signed)
Primary cardiologist: [facility]  HPI: 73 year old male for evaluation of atrial fibrillation. Patient recently complaining of dyspnea on exertion and dizziness. Also had hemoptysis. Chest CT July 1 revealed no pulmonary embolus. However there is a thrombus in the SVC secondary to a large right peritracheal mass. There was a moderate pericardial effusion and small bilateral pleural effusions. There was total right upper lobe collapse. Bilateral adrenal enlargement. Echocardiogram July 2 revealed normal LV function. There was a large pericardial effusion with RV collapse. Patient underwent pericardial window and biopsy of right upper lobe mass on July 3. Followup echocardiogram revealed resolution of pericardial effusion. Patient has developed paroxysmal atrial fibrillation and cardiology asked to evaluate. His dyspnea has improved since his procedure. He has had chest fullness for the past 6 weeks. He denies palpitations.  Medications Prior to Admission  Medication Sig Dispense Refill  . cholecalciferol (VITAMIN D) 1000 UNITS tablet Take 1,000 Units by mouth daily.      . montelukast (SINGULAIR) 10 MG tablet Take 10 mg by mouth every morning.       . simvastatin (ZOCOR) 20 MG tablet Take 20 mg by mouth every evening.         No Known Allergies  Past Medical History  Diagnosis Date  . Hypertension   . Hypercholesteremia   . Seasonal allergies     Past Surgical History  Procedure Laterality Date  . Wrist fracture surgery  2005  . Video bronchoscopy Bilateral 05/12/2014    Procedure: VIDEO BRONCHOSCOPY WITHOUT FLUORO;  Surgeon: Tanda Rockers, MD;  Location: [facility] ENDOSCOPY;  Service: Cardiopulmonary;  Laterality: Bilateral;  . Tonsillectomy      History   Social History  . Marital Status: Married    Spouse Name: N/A    Number of Children: 2  . 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 Smoking  status: Current Some Day Smoker -- 1.50 packs/day for 29 years    Types: Cigars, Cigarettes  . Smokeless tobacco: Former Systems developer    Types: Chew     Comment: Stopped smoking cigs in 1982, currently smoking cigars.   . Alcohol Use: Yes     Comment: rarely  . Drug Use: No  . Sexual Activity: Not on file   Other Topics Concern  . Not on file   Social History Narrative  . No narrative on file    Family History  Problem Relation Age of Onset  . Throat cancer Brother   . CAD Father     ROS:  Recent dyspnea on exertion and hemoptysis but no fevers or chills, productive cough, dysphasia, odynophagia, melena, hematochezia, dysuria, hematuria, rash, seizure activity, orthopnea, PND, pedal edema, claudication. Remaining systems are negative.  Physical Exam:   Blood pressure 88/65, pulse 63, temperature 97.9 F (36.6 C), temperature source Oral, resp. rate 21, height _0  (1.778 m), weight 171 lb 15.3 oz (78 kg), SpO2 97.00%.  General:  Well developed/well nourished in NAD Skin warm/dry Patient not depressed No peripheral clubbing Back-normal HEENT-normal/normal eyelids Neck supple/normal carotid upstroke bilaterally; no bruits; no thyromegaly chest - diminished breath sounds left lower lobe CV -irregular/normal S1 and S2; no murmurs, rubs or gallops;  PMI nondisplaced Abdomen -epigastric incision intact. No tenderness. 2+ femoral pulses, no bruits Ext-no edema, chords, 2+ DP Neuro-grossly nonfocal  ECG 05/10/2014-atrial fibrillation, right bundle branch block, left anterior  fascicular block.  Results for orders placed during the hospital encounter of 05/07/2014 (from the past 48 hour(s))  BODY FLUID CULTURE     Status: None   Collection Time    05/14/2014  4:11 PM      Result Value Ref Range   Specimen Description FLUID PERICARDIAL     Special Requests 50ML FLUID     Gram Stain       Value: WBC PRESENT,BOTH PMN AND MONONUCLEAR     NO ORGANISMS SEEN     Performed at [facility]   Culture       Value: NO GROWTH 2 DAYS     Performed at [facility]   Report Status PENDING    TISSUE CULTURE     Status: None   Collection Time    05/06/2014  5:29 PM      Result Value Ref Range   Specimen Description TISSUE PERICARDIUM     Special Requests NONE     Gram Stain       Value: RARE WBC PRESENT, PREDOMINANTLY PMN     NO ORGANISMS SEEN     Performed at [facility]   Culture       Value: NO GROWTH 1 DAY     Performed at [facility]   Report Status PENDING    GLUCOSE, CAPILLARY     Status: Abnormal   Collection Time    05/17/2014  6:27 PM      Result Value Ref Range   Glucose-Capillary 162 (*) 70 - 99 mg/dL  GLUCOSE, CAPILLARY     Status: Abnormal   Collection Time    05/03/2014  8:03 PM      Result Value Ref Range   Glucose-Capillary 148 (*) 70 - 99 mg/dL   Comment 1 Documented in Chart     Comment 2 Notify RN    GLUCOSE, CAPILLARY     Status: Abnormal   Collection Time    04/17/2014 11:48 PM      Result Value Ref Range   Glucose-Capillary 152 (*) 70 - 99 mg/dL   Comment 1 Documented in Chart     Comment 2 Notify RN    GLUCOSE, CAPILLARY     Status: Abnormal   Collection Time    04/19/14  3:35 AM      Result Value Ref Range   Glucose-Capillary 152 (*) 70 - 99 mg/dL   Comment 1 Documented in Chart     Comment 2 Notify RN    CBC     Status: Abnormal   Collection Time    04/19/14  4:10 AM      Result Value Ref Range   WBC 19.7 (*) 4.0 - 10.5 K/uL   RBC 3.78 (*) 4.22 - 5.81 MIL/uL   Hemoglobin 11.8 (*) 13.0 - 17.0 g/dL   HCT 35.1 (*) 39.0 - 52.0 %   MCV 92.9  78.0 - 100.0 fL   MCH 31.2  26.0 - 34.0 pg   MCHC 33.6  30.0 - 36.0 g/dL   RDW 13.8  11.5 - 15.5 %   Platelets 340  150 - 400 K/uL  BASIC METABOLIC PANEL     Status: Abnormal   Collection Time    04/19/14  4:10 AM      Result Value Ref Range   Sodium 127 (*) 137 - 147 mEq/L   Potassium 4.9  3.7 - 5.3 mEq/L   Chloride 91 (*) 96 - 112  mEq/L   CO2 21  19 - 32 mEq/L    Glucose, Bld 161 (*) 70 - 99 mg/dL   BUN 36 (*) 6 - 23 mg/dL   Creatinine, Ser 0.88  0.50 - 1.35 mg/dL   Calcium 8.6  8.4 - 10.5 mg/dL   GFR calc non Af Amer 84 (*) >90 mL/min   GFR calc Af Amer >90  >90 mL/min   Comment: (NOTE)     The eGFR has been calculated using the CKD EPI equation.     This calculation has not been validated in all clinical situations.     eGFR's persistently <90 mL/min signify possible Chronic Kidney     Disease.   Anion gap 15  5 - 15  GLUCOSE, CAPILLARY     Status: Abnormal   Collection Time    04/19/14  7:57 AM      Result Value Ref Range   Glucose-Capillary 129 (*) 70 - 99 mg/dL   Comment 1 Notify RN    GLUCOSE, CAPILLARY     Status: Abnormal   Collection Time    04/19/14 11:38 AM      Result Value Ref Range   Glucose-Capillary 59 (*) 70 - 99 mg/dL   Comment 1 Notify RN    GLUCOSE, CAPILLARY     Status: None   Collection Time    04/19/14 12:29 PM      Result Value Ref Range   Glucose-Capillary 84  70 - 99 mg/dL  GLUCOSE, CAPILLARY     Status: Abnormal   Collection Time    04/19/14  3:49 PM      Result Value Ref Range   Glucose-Capillary 148 (*) 70 - 99 mg/dL   Comment 1 Notify RN    HEPARIN LEVEL (UNFRACTIONATED)     Status: Abnormal   Collection Time    04/19/14  7:04 PM      Result Value Ref Range   Heparin Unfractionated <0.10 (*) 0.30 - 0.70 IU/mL   Comment:            IF HEPARIN RESULTS ARE BELOW     EXPECTED VALUES, AND PATIENT     DOSAGE HAS BEEN CONFIRMED,     SUGGEST FOLLOW UP TESTING     OF ANTITHROMBIN III LEVELS.  GLUCOSE, CAPILLARY     Status: Abnormal   Collection Time    04/19/14  8:11 PM      Result Value Ref Range   Glucose-Capillary 109 (*) 70 - 99 mg/dL   Comment 1 Documented in Chart     Comment 2 Notify RN    GLUCOSE, CAPILLARY     Status: None   Collection Time    04/19/14 11:55 PM      Result Value Ref Range   Glucose-Capillary 99  70 - 99 mg/dL   Comment 1 Documented in Chart     Comment 2 Notify RN      GLUCOSE, CAPILLARY     Status: Abnormal   Collection Time    04/20/14  4:14 AM      Result Value Ref Range   Glucose-Capillary 118 (*) 70 - 99 mg/dL   Comment 1 Documented in Chart     Comment 2 Notify RN    CBC     Status: Abnormal   Collection Time    04/20/14  5:39 AM      Result Value Ref Range   WBC 20.5 (*) 4.0 - 10.5 K/uL   RBC 3.91 (*)  4.22 - 5.81 MIL/uL   Hemoglobin 12.6 (*) 13.0 - 17.0 g/dL   HCT 36.5 (*) 39.0 - 52.0 %   MCV 93.4  78.0 - 100.0 fL   MCH 32.2  26.0 - 34.0 pg   MCHC 34.5  30.0 - 36.0 g/dL   RDW 14.1  11.5 - 15.5 %   Platelets 342  150 - 400 K/uL  COMPREHENSIVE METABOLIC PANEL     Status: Abnormal   Collection Time    04/20/14  5:39 AM      Result Value Ref Range   Sodium 130 (*) 137 - 147 mEq/L   Potassium 4.7  3.7 - 5.3 mEq/L   Chloride 95 (*) 96 - 112 mEq/L   CO2 21  19 - 32 mEq/L   Glucose, Bld 107 (*) 70 - 99 mg/dL   BUN 24 (*) 6 - 23 mg/dL   Creatinine, Ser 0.80  0.50 - 1.35 mg/dL   Calcium 8.7  8.4 - 10.5 mg/dL   Total Protein 6.0  6.0 - 8.3 g/dL   Albumin 2.5 (*) 3.5 - 5.2 g/dL   AST 16  0 - 37 U/L   ALT 15  0 - 53 U/L   Alkaline Phosphatase 93  39 - 117 U/L   Total Bilirubin 0.7  0.3 - 1.2 mg/dL   GFR calc non Af Amer 87 (*) >90 mL/min   GFR calc Af Amer >90  >90 mL/min   Comment: (NOTE)     The eGFR has been calculated using the CKD EPI equation.     This calculation has not been validated in all clinical situations.     eGFR's persistently <90 mL/min signify possible Chronic Kidney     Disease.   Anion gap 14  5 - 15  HEPARIN LEVEL (UNFRACTIONATED)     Status: Abnormal   Collection Time    04/20/14  5:39 AM      Result Value Ref Range   Heparin Unfractionated <0.10 (*) 0.30 - 0.70 IU/mL   Comment:            IF HEPARIN RESULTS ARE BELOW     EXPECTED VALUES, AND PATIENT     DOSAGE HAS BEEN CONFIRMED,     SUGGEST FOLLOW UP TESTING     OF ANTITHROMBIN III LEVELS.  GLUCOSE, CAPILLARY     Status: Abnormal   Collection Time     04/20/14  8:08 AM      Result Value Ref Range   Glucose-Capillary 126 (*) 70 - 99 mg/dL   Comment 1 Documented in Chart     Comment 2 Notify RN    GLUCOSE, CAPILLARY     Status: Abnormal   Collection Time    04/20/14 11:19 AM      Result Value Ref Range   Glucose-Capillary 119 (*) 70 - 99 mg/dL   Comment 1 Documented in Chart     Comment 2 Notify RN    HEPARIN LEVEL (UNFRACTIONATED)     Status: Abnormal   Collection Time    04/20/14 11:50 AM      Result Value Ref Range   Heparin Unfractionated 0.10 (*) 0.30 - 0.70 IU/mL   Comment:            IF HEPARIN RESULTS ARE BELOW     EXPECTED VALUES, AND PATIENT     DOSAGE HAS BEEN CONFIRMED,     SUGGEST FOLLOW UP TESTING     OF ANTITHROMBIN III LEVELS.  BLOOD  GAS, ARTERIAL     Status: Abnormal   Collection Time    04/20/14  1:10 PM      Result Value Ref Range   FIO2 0.21     pH, Arterial 7.468 (*) 7.350 - 7.450   pCO2 arterial 30.7 (*) 35.0 - 45.0 mmHg   pO2, Arterial 66.5 (*) 80.0 - 100.0 mmHg   Bicarbonate 22.0  20.0 - 24.0 mEq/L   TCO2 23.0  0 - 100 mmol/L   Acid-base deficit 1.3  0.0 - 2.0 mmol/L   O2 Saturation 96.4     Patient temperature 97.9     Collection site RIGHT RADIAL     Drawn by [id number]     Sample type ARTERIAL DRAW     Allens test (pass/fail) PASS  PASS    Dg Chest Port 1 View  04/20/2014   CLINICAL DATA:  Shortness of breath. Pericardial window procedure 2 days ago.  EXAM: PORTABLE CHEST - 1 VIEW 1331 hr  COMPARISON:  Portable chest x-rays earlier same 0453 hr and dating back to 05/16/2014. Two-view chest x-ray 04/15/2014. CT chest 04/12/2014.  FINDINGS: Atelectasis involving the right upper lobe and right upper lobe lung mass as noted on prior examinations, unchanged. Bilateral pleural effusions, left greater than right, and associated dense consolidation in the left lower lobe, unchanged since earlier today in yesterday, but worse than on the examination 2 days ago. No new pulmonary parenchymal abnormalities.  Cardiac silhouette upper normal in size to slightly enlarged for technique. Pulmonary vascularity normal without evidence of pulmonary edema.  IMPRESSION: 1. Stable right upper lobe atelectasis and right upper lobe lung mass. 2. Bilateral pleural effusions, left greater than right, with and associated dense passive atelectasis and/or pneumonia in the left lower lobe, stable since yesterday but increased since 2 days ago. 3. No new abnormalities since yesterday.   Electronically Signed   By: Evangeline Dakin M.D.   On: 04/20/2014 13:56   Dg Chest Port 1 View  04/20/2014   CLINICAL DATA:  Cardiac tamponade  EXAM: PORTABLE CHEST - 1 VIEW  COMPARISON:  Yesterday.  FINDINGS: Stable enlargement of the cardiac silhouette, left basilar airspace opacity, bilateral pleural fluid and right upper lobe collapse. Stable calcified granuloma at the right lung base. Lower thoracic spine degenerative changes.  IMPRESSION: 1. Stable cardiomegaly, left lower lobe atelectasis or pneumonia, bilateral pleural effusions and right upper lobe collapse. 2. No acute abnormality.   Electronically Signed   By: Enrique Sack M.D.   On: 04/20/2014 07:09   Dg Chest Port 1 View  04/19/2014   CLINICAL DATA:  post op pericardial window  EXAM: PORTABLE CHEST - 1 VIEW  COMPARISON:  Portal chest radiograph dated 05/04/2014  FINDINGS: Cardiac silhouette stable. Pericardial drain inferior aspect of the heart stable. Postobstructive right upper collapsed due to a central mediastinal mass stable. Persistent density left retrocardiac region. The aerated portion of the lungs are clear. Stable calcified granuloma right lung base. There is blunting of the left costophrenic angle. Mild degenerative changes in the shoulders.  IMPRESSION: Stable chest radiograph.   Electronically Signed   By: Margaree Mackintosh M.D.   On: 04/19/2014 08:01   Dg Chest Port 1 View  05/15/2014   CLINICAL DATA:  Postop pericardial window. Mediastinal mass and SVC obstruction.  EXAM:  PORTABLE CHEST - 1 VIEW  COMPARISON:  Radiographs 04/15/2014.  CT 04/17/2014.  FINDINGS: 1841 hr. A drain is noted over the heart consistent with a pericardial drain. The cardiac silhouette  is not significantly decreased in size. There is increased retrocardiac pulmonary opacity. Left pleural effusion appears about the same. There is no pneumothorax. The large mediastinal mass and resulting right upper lobe collapse are unchanged.  IMPRESSION: Interval pericardial drain placement without demonstrated complication. There is increased left lower lobe airspace disease which may reflect atelectasis or aspiration. The mediastinal mass and right upper lobe collapse have not significantly changed.   Electronically Signed   By: Camie Patience M.D.   On: 05/17/2014 19:01   Dg Chest Port 1 View  04/20/2014   CLINICAL DATA:  Preop exam for pericardial window. Respiratory distress.  EXAM: PORTABLE CHEST - 1 VIEW  COMPARISON:  CT, 04/17/2014 and chest radiograph, 04/15/2014  FINDINGS: Right peritracheal/upper lobe mass and right upper lobe atelectasis is without change. There is some opacity at the left lung base that is likely atelectasis. No edema or convincing pneumonia. Cardiopericardial silhouette is mildly enlarged but also unchanged.  Bony thorax is demineralized but grossly intact.  IMPRESSION: Stable appearance from the previous chest radiograph and chest CT. No acute findings in the lungs.   Electronically Signed   By: Lajean Manes M.D.   On: 05/07/2014 16:37    Assessment/Plan 1 atrial fibrillation with a rapid ventricular response-the patient's blood pressure is borderline. He is relatively asymptomatic. I will add IV amiodarone both for rate control and hopefully to assist with conversion to sinus rhythm. If rate is not controlled with IV amiodarone low-dose metoprolol to be added. Atrial fibrillation most likely related to recent pericardial window. Continue IV heparin. 2 lung CA-await biopsy results. 3 SVC  syndrome with thrombus-continue IV heparin. 4 large pericardial effusion with tamponade status post pericardial window-quick look bedside echocardiogram shows no reaccumulation.  Kirk Ruths MD 04/20/2014, 2:02 PM

## 2014-04-20 NOTE — Progress Notes (Signed)
[facility]  Nurse notes worsening dyspnea, hypoxemia, continued tachycardia Will obtain CXR, ABG, give metoprolol 5mg  Cardiology consult  [name], MD [place] [facility] Pager: [phone] Cell: [phone] If no response, call [phone]

## 2014-04-20 NOTE — Progress Notes (Signed)
ANTICOAGULATION CONSULT NOTE: HEPARIN Indication: Clot in SVC and right atrium  Heparin level: 0.1 on 1700 units/hr Heparin level goal: 0.3-0.7  The heparin level remains subtherapeutic. The MD has requested no bolus due to recent surgery. Will increase the rate to 1900 units/hr and recheck the level in 6 hrs.  Arrie Senate, PharmD

## 2014-04-20 NOTE — Progress Notes (Signed)
Pt converted into rapid atrial fib with HR in the 140-150's. VSS. Md [place] Bells made aware- new orders for IV amio load and infusion ordered  Lorre Munroe

## 2014-04-20 NOTE — Progress Notes (Signed)
      [address]       [place] [address]             [phone]        CARDIOTHORACIC SURGERY PROGRESS NOTE   R2 Days Post-Op Procedure(s) (LRB): INTRAOPERATIVE TRANSESOPHAGEAL ECHOCARDIOGRAM (N/A) VIDEO BRONCHOSCOPY SUBXYPHOID PERICARDIAL WINDOW (N/A)  Subjective: Feels pretty well although had some increased SOB overnight  Objective: Vital signs: BP Readings from Last 1 Encounters:  04/20/14 109/62   Pulse Readings from Last 1 Encounters:  04/20/14 86   Resp Readings from Last 1 Encounters:  04/20/14 22   Temp Readings from Last 1 Encounters:  04/20/14 97.7 F (36.5 C) Oral    Hemodynamics:    Physical Exam:  Rhythm:   sinus  Breath sounds: clear  Heart sounds:  RRR  Incisions:  Clean and dry  Abdomen:  Soft, non-distended, non-tender  Extremities:  Warm, well-perfused  Chest tube:  Trivial output   Intake/Output from previous day: 07/03 0701 - 07/04 0700 In: 710.6 [I.V.:660.6; IV Piggyback:50] Out: 1180 [Urine:1160; Chest Tube:20] Intake/Output this shift: Total I/O In: 94 [I.V.:94] Out: 130 [Urine:130]  Lab Results:  CBC: Recent Labs  04/19/14 0410 04/20/14 0539  WBC 19.7* 20.5*  HGB 11.8* 12.6*  HCT 35.1* 36.5*  PLT 340 342    BMET:  Recent Labs  04/19/14 0410 04/20/14 0539  NA 127* 130*  K 4.9 4.7  CL 91* 95*  CO2 21 21  GLUCOSE 161* 107*  BUN 36* 24*  CREATININE 0.88 0.80  CALCIUM 8.6 8.7     CBG (last 3)   Recent Labs  04/19/14 2355 04/20/14 0414 04/20/14 0808  GLUCAP 99 118* 126*    ABG No results found for this basename: phart, pco2art, po2art, hco3, tco2, acidbasedef, o2sat    CXR: PORTABLE CHEST - 1 VIEW  COMPARISON: Yesterday.  FINDINGS:  Stable enlargement of the cardiac silhouette, left basilar airspace  opacity, bilateral pleural fluid and right upper lobe collapse.  Stable calcified granuloma at the right lung base. Lower thoracic  spine degenerative changes.  IMPRESSION:  1.  Stable cardiomegaly, left lower lobe atelectasis or pneumonia,  bilateral pleural effusions and right upper lobe collapse.  2. No acute abnormality.  Electronically Signed  By: Enrique Sack M.D.  On: 04/20/2014 07:09    Assessment/Plan: S/P Procedure(s) (LRB): INTRAOPERATIVE TRANSESOPHAGEAL ECHOCARDIOGRAM (N/A) VIDEO BRONCHOSCOPY SUBXYPHOID PERICARDIAL WINDOW (N/A)  Overall stable POD2 Trivial chest tube output Hypoxemia with increased O2 requirement - high risk for PE   D/C chest tube  Continue IV heparin  Plan per Pulm/CCM team  Jerlisa Diliberto H 04/20/2014 10:09 AM

## 2014-04-20 NOTE — Progress Notes (Signed)
Notified Elink Physician about decreased UOP over last 3 hours.  No new orders received.  Will continue to monitor pt closely.

## 2014-04-20 NOTE — Progress Notes (Signed)
ANTICOAGULATION CONSULT NOTE - Follow Up Consult  Pharmacy Consult for heparin Indication: clot in SVC and right atrium  Labs:  Recent Labs  04/17/14 1715 05/16/2014 1030 04/19/14 0410 04/19/14 1904 04/20/14 0539  HGB 12.4* 12.9* 11.8*  --  12.6*  HCT 36.7* 37.0* 35.1*  --  36.5*  PLT 483* 456* 340  --  342  HEPARINUNFRC  --   --   --  <0.10* <0.10*  CREATININE 1.80*  1.63* 1.38* 0.88  --   --     Assessment: 72yo male remains undetectable on heparin despite rate increase.  Goal of Therapy:  Heparin level 0.3-0.7 units/ml   Plan:  Will increase heparin gtt by 4 units/kg/hr to 1700 units/hr and check level in 6hr.  Wynona Neat, PharmD, BCPS  04/20/2014,6:20 AM

## 2014-04-20 NOTE — Progress Notes (Signed)
PULMONARY / CRITICAL CARE MEDICINE   Name: Dillon Willis MRN: [id number] DOB: [birth year]    ADMISSION DATE:  04/19/2014  PRIMARY SERVICE: PCCM  PCP : Dr. Nyra Capes   CHIEF COMPLAINT:  Severe shortness of breath and dizziness   BRIEF PATIENT DESCRIPTION: 60 yowm smoker referred to office 04/17/14 for intermittent hemoptysis  And RUL lung mass complicated by moderate pericardial effusion and hyponatremia and hypotension.    SIGNIFICANT EVENTS / STUDIES:  CTA chest 04/17/14 >neg PE, thrombus within the superior vena cava secondary to mass effect from the large right paratracheal mass. Venous return from the head and upper  thorax is via collaterals.  The pericardial effusion has increased and is now moderate in  size with maximal thickness of 2.6 cm. The small bilateral pleural effusions have increased.  total right upper lobe collapse. There is subsegmental atelectasis on the left. . There is bilateral adrenal enlargement 2D Echo07/08/2014 >>> tamponade. 7/2 emergent pericardial window and bronchoscopy with RUL mass biopsy by Dr. Nils Pyle 2D Echo post  Window > no more tamponade, RA clot, SVC clot, LVEF 50-55%   LINES / TUBES: 7/2 Sub xyphoid pericardial tube >>  CULTURES: 7/2 pericardium >>  ANTIBIOTICS: 7/2 periop cefuroxime >>  SUBJECTIVE: didn't sleep well, some throat soreness/tightness this morning, increased O2 requirements  VITAL SIGNS: Temp:  [97.6 F (36.4 C)-98.3 F (36.8 C)] 97.7 F (36.5 C) (07/04 0810) Pulse Rate:  [73-87] 86 (07/04 0900) Resp:  [15-29] 27 (07/04 0900) BP: (90-118)/(55-68) 101/61 mmHg (07/04 0900) SpO2:  [91 %-100 %] 93 % (07/04 0900) Weight:  [78 kg (171 lb 15.3 oz)] 78 kg (171 lb 15.3 oz) (07/04 0630) HEMODYNAMICS:   VENTILATOR SETTINGS:   INTAKE / OUTPUT: Intake/Output     07/03 0701 - 07/04 0700 07/04 0701 - 07/05 0700   P.O.     I.V. (mL/kg) 660.6 (8.5) 74 (0.9)   IV Piggyback 50    Total Intake(mL/kg) 710.6 (9.1) 74 (0.9)   Urine  (mL/kg/hr) 1160 (0.6) 70 (0.4)   Blood     Chest Tube 20 (0)    Total Output 1180 70   Net -469.4 +4          PHYSICAL EXAMINATION:  Gen: standing up, comfortable HEENT: NCAT, EOMi, OP clear,  PULM: bronchial breath sounds RUL, CTA otherwise CV: RRR, no mgr, no JVD, pericardial drain in place AB: BS+, soft, nontender, no hsm Ext: warm, no edema, no clubbing, no cyanosis Neuro: A&Ox4, MAEW   LABS:  CBC  Recent Labs Lab 05/08/2014 1030 04/19/14 0410 04/20/14 0539  WBC 22.5* 19.7* 20.5*  HGB 12.9* 11.8* 12.6*  HCT 37.0* 35.1* 36.5*  PLT 456* 340 342   Coag's No results found for this basename: APTT, INR,  in the last 168 hours BMET  Recent Labs Lab 05/05/2014 1030 04/19/14 0410 04/20/14 0539  NA 122* 127* 130*  K 5.0 4.9 4.7  CL 84* 91* 95*  CO2 17* 21 21  BUN 43* 36* 24*  CREATININE 1.38* 0.88 0.80  GLUCOSE 132* 161* 107*   Electrolytes  Recent Labs Lab 04/28/2014 1030 04/19/14 0410 04/20/14 0539  CALCIUM 9.9 8.6 8.7  MG 2.6*  --   --   PHOS 4.7*  --   --    Sepsis Markers No results found for this basename: LATICACIDVEN, PROCALCITON, O2SATVEN,  in the last 168 hours ABG No results found for this basename: PHART, PCO2ART, PO2ART,  in the last 168 hours Liver Enzymes  Recent  Labs Lab 05/02/2014 1030 04/20/14 0539  AST 19 16  ALT 15 15  ALKPHOS 104 93  BILITOT 0.7 0.7  ALBUMIN 3.4* 2.5*   Cardiac Enzymes  Recent Labs Lab 04/17/14 1715 04/27/2014 1030  PROBNP 1390.0* 1408.0*   Glucose  Recent Labs Lab 04/19/14 1229 04/19/14 1549 04/19/14 2011 04/19/14 2355 04/20/14 0414 04/20/14 0808  GLUCAP 84 148* 109* 99 118* 126*    Imaging   7/4 CXR:  Pericardial drain noted, RUL Mass/collapse, LLL effusion vs atelectasis  ASSESSMENT / PLAN:  PULMONARY A: 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 P: F/u biopsy report Anticoagulation for SVC syndrome > change to xarelto 7/5? Once we know diagnosis will need staging imaging prior to XRT vs chemo Monitor for throat/facial swelling with SVC O2 as needed OOB Incentive spirometry Daily CXR  CARDIOVASCULAR A: Tamponade resolved post pericardial window Hypotension >HTN rx stopped 7/1 (norvasc/ACEI )  Afib with RVR 7/3 AM > back to sinus rhythm with amiodarone P:  Per CT surgery F/u path from pericardial biopsy Continue Amiodarone PO through 7/6, consider d/c  RENAL A:  Hyponatremia improving, likely lung mass related Hyperkalemia resolved AKI resolved P:  KVO fluids Monitor electrolytes closely and replete as needed  GASTROINTESTINAL A:  No acute issues P:   Diet as tolerated D/c PPI for GI proph  HEMATOLOGIC A:  Leukocytosis > stress reaction + hydrocortisone P:  Monitor CBC  INFECTIOUS A:  No acute infectious issues P:   Monitor wbc/temp tr   ENDOCRINE A:  Hyperglycemia  P:   Stop Hydrocortisone 100mg  every 6hr  SSI   NEUROLOGIC A:  Intact  P:   Monitor closely with electrolytes imbalances   Transfer to SDU, PCCM happy to assume care as primary service  [name], MD [place] [facility] Pager: [phone] Cell: [phone] If no response, call [phone]  04/20/2014, 9:29 AM

## 2014-04-20 NOTE — Progress Notes (Signed)
ANTICOAGULATION CONSULT NOTE - Follow Up Consult  Pharmacy Consult for heparin Indication: clot in SVC and right atrium  Labs:  Recent Labs  05/08/2014 1030 04/19/14 0410  04/20/14 0539 04/20/14 1150 04/20/14 2236  HGB 12.9* 11.8*  --  12.6*  --   --   HCT 37.0* 35.1*  --  36.5*  --   --   PLT 456* 340  --  342  --   --   HEPARINUNFRC  --   --   < > <0.10* 0.10* 0.55  CREATININE 1.38* 0.88  --  0.80  --   --   < > = values in this interval not displayed.   Assessment/Plan:  73yo male now therapeutic on heparin after rate adjustments. Will continue gtt at current rate and confirm stable with am labs.   Wynona Neat, PharmD, BCPS  04/20/2014,11:26 PM

## 2014-04-20 NOTE — Progress Notes (Signed)
Pt still in rapid afib after amio load and infusion. Pt is now symptomatic feeling "flushed and dizzy". MD McQuaid called to make aware. TX orders cancelled, cardiology consult placed by MD and order for ABG obtained.   Will monitor.   Lorre Munroe

## 2014-04-20 NOTE — Consult Note (Signed)
  Amiodarone Drug - Drug Interaction Consult Note  Recommendations: Amiodarone is metabolized by the cytochrome P450 system and therefore has the potential to cause many drug interactions. Amiodarone has an average plasma half-life of 50 days (range 20 to 100 days).   There is potential for drug interactions to occur several weeks or months after stopping treatment and the onset of drug interactions may be slow after initiating amiodarone.   [x]  Statins: Increased risk of myopathy. Simvastatin- restrict dose to 20mg  daily. Other statins: counsel patients to report any muscle pain or weakness immediately.  []  Anticoagulants: Amiodarone can increase anticoagulant effect. Consider warfarin dose reduction. Patients should be monitored closely and the dose of anticoagulant altered accordingly, remembering that amiodarone levels take several weeks to stabilize.  []  Antiepileptics: Amiodarone can increase plasma concentration of phenytoin, the dose should be reduced. Note that small changes in phenytoin dose can result in large changes in levels. Monitor patient and counsel on signs of toxicity.  []  Beta blockers: increased risk of bradycardia, AV block and myocardial depression. Sotalol - avoid concomitant use.  []   Calcium channel blockers (diltiazem and verapamil): increased risk of bradycardia, AV block and myocardial depression.  []   Cyclosporine: Amiodarone increases levels of cyclosporine. Reduced dose of cyclosporine is recommended.  []  Digoxin dose should be halved when amiodarone is started.  []  Diuretics: increased risk of cardiotoxicity if hypokalemia occurs.  []  Oral hypoglycemic agents (glyburide, glipizide, glimepiride): increased risk of hypoglycemia. Patient's glucose levels should be monitored closely when initiating amiodarone therapy.   []  Drugs that prolong the QT interval:  Torsades de pointes risk may be increased with concurrent use - avoid if possible.  Monitor QTc, also  keep magnesium/potassium WNL if concurrent therapy can't be avoided. Marland Kitchen Antibiotics: e.g. fluoroquinolones, erythromycin. . Antiarrhythmics: e.g. quinidine, procainamide, disopyramide, sotalol. . Antipsychotics: e.g. phenothiazines, haloperidol.  . Lithium, tricyclic antidepressants, and methadone. Thank You,  Minta Balsam  04/20/2014 2:05 PM

## 2014-04-21 ENCOUNTER — Inpatient Hospital Stay (HOSPITAL_COMMUNITY): Payer: Medicare Other

## 2014-04-21 DIAGNOSIS — I4891 Unspecified atrial fibrillation: Secondary | ICD-10-CM

## 2014-04-21 LAB — BASIC METABOLIC PANEL
Anion gap: 15 (ref 5–15)
BUN: 24 mg/dL — AB (ref 6–23)
CHLORIDE: 94 meq/L — AB (ref 96–112)
CO2: 21 meq/L (ref 19–32)
CREATININE: 0.88 mg/dL (ref 0.50–1.35)
Calcium: 8.7 mg/dL (ref 8.4–10.5)
GFR calc Af Amer: >90 mL/min (ref >90)
GFR calc non Af Amer: 84 mL/min — ABNORMAL LOW (ref >90)
GLUCOSE: 118 mg/dL — AB (ref 70–99)
Potassium: 4.4 mEq/L (ref 3.7–5.3)
Sodium: 130 mEq/L — ABNORMAL LOW (ref 137–147)

## 2014-04-21 LAB — GLUCOSE, CAPILLARY
GLUCOSE-CAPILLARY: 158 mg/dL — AB (ref 70–99)
Glucose-Capillary: 126 mg/dL — ABNORMAL HIGH (ref 70–99)
Glucose-Capillary: 122 mg/dL — ABNORMAL HIGH (ref 70–99)
Glucose-Capillary: 109 mg/dL — ABNORMAL HIGH (ref 70–99)

## 2014-04-21 LAB — BODY FLUID CULTURE: Culture: NO GROWTH

## 2014-04-21 LAB — CBC
HEMATOCRIT: 35.1 % — AB (ref 39.0–52.0)
HEMOGLOBIN: 11.6 g/dL — AB (ref 13.0–17.0)
MCH: 31.1 pg (ref 26.0–34.0)
MCHC: 33 g/dL (ref 30.0–36.0)
MCV: 94.1 fL (ref 78.0–100.0)
Platelets: 331 10*3/uL (ref 150–400)
RBC: 3.73 MIL/uL — AB (ref 4.22–5.81)
RDW: 14.2 % (ref 11.5–15.5)
WBC: 19.7 10*3/uL — ABNORMAL HIGH (ref 4.0–10.5)

## 2014-04-21 LAB — HEPARIN LEVEL (UNFRACTIONATED): Heparin Unfractionated: 0.4 IU/mL (ref 0.30–0.70)

## 2014-04-21 MED ORDER — FUROSEMIDE 10 MG/ML IJ SOLN
40.0000 mg | Freq: Once | INTRAMUSCULAR | Status: AC
  Administered 2014-04-21: 40 mg via INTRAVENOUS
  Filled 2014-04-21: qty 4

## 2014-04-21 MED ORDER — WARFARIN SODIUM 7.5 MG PO TABS
7.5000 mg | ORAL_TABLET | Freq: Once | ORAL | Status: AC
  Administered 2014-04-21: 7.5 mg via ORAL
  Filled 2014-04-21: qty 1

## 2014-04-21 MED ORDER — SENNA 8.6 MG PO TABS
1.0000 | ORAL_TABLET | Freq: Every day | ORAL | Status: DC | PRN
  Filled 2014-04-21: qty 1

## 2014-04-21 MED ORDER — DOCUSATE SODIUM 100 MG PO CAPS
100.0000 mg | ORAL_CAPSULE | Freq: Two times a day (BID) | ORAL | Status: DC
  Administered 2014-04-21 – 2014-04-27 (×7): 100 mg via ORAL
  Filled 2014-04-21 (×14): qty 1

## 2014-04-21 MED ORDER — WARFARIN - PHARMACIST DOSING INPATIENT
Freq: Every day | Status: DC
  Administered 2014-04-23 – 2014-04-25 (×2)

## 2014-04-21 NOTE — Progress Notes (Addendum)
ANTICOAGULATION CONSULT NOTE - Follow Up Consult  Pharmacy Consult for Heparin and Coumadin Indication: clot in SVC and right atrium  No Known Allergies  Patient Measurements: Height: 5\' 10"  (177.8 cm) Weight: 173 lb 15.1 oz (78.9 kg) IBW/kg (Calculated) : 73 Heparin Dosing Weight: 79kg  Vital Signs: Temp: 98.1 F (36.7 C) (07/05 0733) Temp src: Oral (07/05 0733) BP: 121/66 mmHg (07/05 0900) Pulse Rate: 75 (07/05 0900)  Labs:  Recent Labs  04/19/14 0410  04/20/14 0539 04/20/14 1150 04/20/14 2236 04/21/14 0246  HGB 11.8*  --  12.6*  --   --  11.6*  HCT 35.1*  --  36.5*  --   --  35.1*  PLT 340  --  342  --   --  331  HEPARINUNFRC  --   < > <0.10* 0.10* 0.55 0.40  CREATININE 0.88  --  0.80  --   --  0.88  < > = values in this interval not displayed.  Estimated Creatinine Clearance: 78.3 ml/min (by C-G formula based on Cr of 0.88).   Medications:  Heparin @ 1900 units/hr  Assessment: 72yom continues on heparin for clot in the SVC and right atrium 2/2 malignancy. Heparin level is therapeutic. He will begin coumadin today. Coumadin score = 5. Will need to watch for drug interaction with amiodarone. No baseline INR but can assume normal (LFTs wnl).  Goal of Therapy:  INR 2-3 Heparin level 0.3-0.7 units/ml Monitor platelets by anticoagulation protocol: Yes   Plan:  1) Continue heparin at 1900 units/hr 2) Coumadin 7.5mg  x 1 3) Coumadin education once out of ICU 4) Heparin level, INR, CBC in AM  Deboraha Sprang 04/21/2014,9:49 AM

## 2014-04-21 NOTE — Progress Notes (Signed)
    Subjective:  Denies CP or dyspnea   Objective:  Filed Vitals:   04/21/14 0640 04/21/14 0700 04/21/14 0733 04/21/14 0800  BP:  106/67  122/72  Pulse:  70  73  Temp:   98.1 F (36.7 C)   TempSrc:   Oral   Resp:  21  23  Height:      Weight: 173 lb 15.1 oz (78.9 kg)     SpO2: 95% 94%  96%    Intake/Output from previous day:  Intake/Output Summary (Last 24 hours) at 04/21/14 0835 Last data filed at 04/21/14 0800  Gross per 24 hour  Intake 1712.87 ml  Output    640 ml  Net 1072.87 ml    Physical Exam: Physical exam: Well-developed well-nourished in no acute distress.  Skin is warm and dry.  HEENT is normal.  Neck is supple.  Chest with diminished BS LLL Cardiovascular exam is regular rate and rhythm.  Abdominal exam previous incision from pericardial window Extremities show no edema. neuro grossly intact    Lab Results: Basic Metabolic Panel:  Recent Labs  04/29/2014 1030  04/20/14 0539 04/21/14 0246  NA 122*  < > 130* 130*  K 5.0  < > 4.7 4.4  CL 84*  < > 95* 94*  CO2 17*  < > 21 21  GLUCOSE 132*  < > 107* 118*  BUN 43*  < > 24* 24*  CREATININE 1.38*  < > 0.80 0.88  CALCIUM 9.9  < > 8.7 8.7  MG 2.6*  --   --   --   PHOS 4.7*  --   --   --   < > = values in this interval not displayed. CBC:  Recent Labs  04/23/2014 1030  04/20/14 0539 04/21/14 0246  WBC 22.5*  < > 20.5* 19.7*  NEUTROABS 20.0*  --   --   --   HGB 12.9*  < > 12.6* 11.6*  HCT 37.0*  < > 36.5* 35.1*  MCV 93.7  < > 93.4 94.1  PLT 456*  < > 342 331  < > = values in this interval not displayed.   Assessment/Plan:  1 atrial fibrillation with a rapid ventricular response-Back in sinus; continue IV amiodarone today and transition to po in AM. Atrial fibrillation most likely related to recent pericardial window. Continue IV heparin.  2 lung CA-await biopsy results.  3 SVC syndrome with thrombus-continue IV heparin.  4 large pericardial effusion with tamponade status post pericardial  window-quick look bedside echocardiogram 7/4 showed no reaccumulation.   Kirk Ruths 04/21/2014, 8:35 AM

## 2014-04-21 NOTE — Progress Notes (Signed)
04/21/2014 3:58 PM Nursing note Pt. Ambulated in hallway 100 ft with RW, RN and on RA . Pt. Tolerated well. Encouraged further ambulation this evening. During ambulation Rn noted moderate swelling in left hand. IV access currently infusing in Left AC but without complication, pain or sign of infiltration at site. IV team notified to further evaluate for possible need to restart. Will continue to closely monitor patient.  Taelyn Broecker, Arville Lime

## 2014-04-21 NOTE — Progress Notes (Signed)
PULMONARY / CRITICAL CARE MEDICINE   Name: Dillon Willis MRN: [id number] DOB: 04-05-1941    ADMISSION DATE:  05/03/2014  PRIMARY SERVICE: PCCM  PCP : Dr. Nyra Capes   CHIEF COMPLAINT:  Severe shortness of breath and dizziness   BRIEF PATIENT DESCRIPTION: 44 yowm smoker referred to office 04/17/14 for intermittent hemoptysis  And RUL lung mass complicated by moderate pericardial effusion and hyponatremia and hypotension.    SIGNIFICANT EVENTS / STUDIES:  CTA chest 04/17/14 >neg PE, thrombus within the superior vena cava secondary to mass effect from the large right paratracheal mass. Venous return from the head and upper  thorax is via collaterals.  The pericardial effusion has increased and is now moderate in  size with maximal thickness of 2.6 cm. The small bilateral pleural effusions have increased.  total right upper lobe collapse. There is subsegmental atelectasis on the left. . There is bilateral adrenal enlargement 2D Echo07/02/2014 >>> tamponade. 7/2 emergent pericardial window and bronchoscopy with RUL mass biopsy by Dr. Nils Pyle 2D Echo post  Window > no more tamponade, RA clot, SVC clot, LVEF 50-55% 7/4 AFib with RVR, ICU transfer held  Rob[place] / TUBES: 7/2 Sub xyphoid pericardial tube >>7/4  CULTURES: 7/2 pericardium >>  ANTIBIOTICS: 7/2 periop cefuroxime >>  SUBJECTIVE: SDU transfer held for dyspnea, hypoxemia  VITAL SIGNS: Temp:  [97.8 F (36.6 C)-98.1 F (36.7 C)] 98.1 F (36.7 C) (07/05 0733) Pulse Rate:  [57-112] 73 (07/05 0800) Resp:  [19-29] 23 (07/05 0800) BP: (78-122)/(52-72) 122/72 mmHg (07/05 0800) SpO2:  [93 %-98 %] 96 % (07/05 0800) Weight:  [78.9 kg (173 lb 15.1 oz)] 78.9 kg (173 lb 15.1 oz) (07/05 0640) HEMODYNAMICS:   VENTILATOR SETTINGS:   INTAKE / OUTPUT: Intake/Output     07/04 0701 - 07/05 0700 07/05 0701 - 07/06 0700   P.O. 120    I.V. (mL/kg) 1584.2 (20.1) 45.7 (0.6)   IV Piggyback     Total Intake(mL/kg) 1704.2 (21.6) 45.7 (0.6)   Urine  (mL/kg/hr) 670 (0.4)    Chest Tube     Total Output 670     Net +1034.2 +45.7          PHYSICAL EXAMINATION:  Gen: standing up, comfortable HEENT: NCAT, EOMi, OP clear,  PULM: bronchial breath sounds RUL, CTA otherwise CV: RRR, no mgr, no JVD, pericardial drain in place AB: BS+, soft, nontender, no hsm Ext: warm, no edema, no clubbing, no cyanosis Neuro: A&Ox4, MAEW   LABS:  CBC  Recent Labs Lab 04/19/14 0410 04/20/14 0539 04/21/14 0246  WBC 19.7* 20.5* 19.7*  HGB 11.8* 12.6* 11.6*  HCT 35.1* 36.5* 35.1*  PLT 340 342 331   Coag's No results found for this basename: APTT, INR,  in the last 168 hours BMET  Recent Labs Lab 04/19/14 0410 04/20/14 0539 04/21/14 0246  NA 127* 130* 130*  K 4.9 4.7 4.4  CL 91* 95* 94*  CO2 21 21 21   BUN 36* 24* 24*  CREATININE 0.88 0.80 0.88  GLUCOSE 161* 107* 118*   Electrolytes  Recent Labs Lab 04/29/2014 1030 04/19/14 0410 04/20/14 0539 04/21/14 0246  CALCIUM 9.9 8.6 8.7 8.7  MG 2.6*  --   --   --   PHOS 4.7*  --   --   --    Sepsis Markers No results found for this basename: LATICACIDVEN, PROCALCITON, O2SATVEN,  in the last 168 hours ABG  Recent Labs Lab 04/20/14 1310  PHART 7.468*  PCO2ART 30.7*  PO2ART 66.5*  Liver Enzymes  Recent Labs Lab 05/13/2014 1030 04/20/14 0539  AST 19 16  ALT 15 15  ALKPHOS 104 93  BILITOT 0.7 0.7  ALBUMIN 3.4* 2.5*   Cardiac Enzymes  Recent Labs Lab 04/17/14 1715 04/27/2014 1030  PROBNP 1390.0* 1408.0*   Glucose  Recent Labs Lab 04/19/14 2355 04/20/14 0414 04/20/14 0808 04/20/14 1119 04/20/14 1551 04/20/14 2003  GLUCAP 99 118* 126* 119* 154* 131*    Imaging   7/5 CXR:  RLL collapse, RUL Mass/collapse, LLL effusion vs atelectasis  ASSESSMENT / PLAN:  PULMONARY A: 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 >S/p endobronchial biopsy on 7/3  P: Anticoagulation for SVC syndrome > start warfarin 7/5 Once we know  diagnosis will need staging imaging prior to XRT vs chemo Monitor for throat/facial swelling with SVC O2 as needed OOB Incentive spirometry, flutter Daily CXR  CARDIOVASCULAR A:  Tamponade resolved post pericardial window Hypotension >HTN rx stopped 7/1 (norvasc/ACEI )  Afib with RVR 7/4 PM > back to sinus rhythm with amiodarone P:  Appreciate cardiology Amiodarone per cards F/u path from pericardial biopsy Tele bed  RENAL A:  Hyponatremia improving, likely lung mass related P:  KVO fluids Monitor electrolytes closely and replete as needed  GASTROINTESTINAL A:  No acute issues P:   Diet as tolerated  HEMATOLOGIC A:  Leukocytosis > stress reaction + hydrocortisone P:  Monitor CBC  INFECTIOUS A:  No acute infectious issues P:   Monitor wbc/temp tr   ENDOCRINE A:  Hyperglycemia  P:   SSI   NEUROLOGIC A:  Intact  P:   Nothing to do  Transfer to tele, 7/5  [name], MD [place] [facility] Pager: [phone] Cell: [phone] If no response, call [phone]  04/21/2014, 8:10 AM

## 2014-04-21 NOTE — Progress Notes (Signed)
      [address]       [place] [address]             [phone]        CARDIOTHORACIC SURGERY PROGRESS NOTE   R3 Days Post-Op Procedure(s) (LRB): INTRAOPERATIVE TRANSESOPHAGEAL ECHOCARDIOGRAM (N/A) VIDEO BRONCHOSCOPY SUBXYPHOID PERICARDIAL WINDOW (N/A)  Subjective: Feels well.  Minimal pain.  Denies SOB  Objective: Vital signs: BP Readings from Last 1 Encounters:  04/21/14 121/66   Pulse Readings from Last 1 Encounters:  04/21/14 75   Resp Readings from Last 1 Encounters:  04/21/14 31   Temp Readings from Last 1 Encounters:  04/21/14 98.1 F (36.7 C) Oral    Hemodynamics:    Physical Exam:  Rhythm:   sinus  Breath sounds: Diminished on right  Heart sounds:  RRR  Incisions:  Clean and dry  Abdomen:  Soft, non-distended, non-tender  Extremities:  Warm, well-perfused    Intake/Output from previous day: 07/04 0701 - 07/05 0700 In: 1704.2 [P.O.:120; I.V.:1584.2] Out: 670 [Urine:670] Intake/Output this shift: Total I/O In: 137.1 [I.V.:137.1] Out: 1000 [Urine:1000]  Lab Results:  CBC: Recent Labs  04/20/14 0539 04/21/14 0246  WBC 20.5* 19.7*  HGB 12.6* 11.6*  HCT 36.5* 35.1*  PLT 342 331    BMET:  Recent Labs  04/20/14 0539 04/21/14 0246  NA 130* 130*  K 4.7 4.4  CL 95* 94*  CO2 21 21  GLUCOSE 107* 118*  BUN 24* 24*  CREATININE 0.80 0.88  CALCIUM 8.7 8.7     CBG (last 3)   Recent Labs  04/20/14 1551 04/20/14 2003 04/21/14 0731  GLUCAP 154* 131* 126*    ABG    Component Value Date/Time   PHART 7.468* 04/20/2014 1310   PCO2ART 30.7* 04/20/2014 1310   PO2ART 66.5* 04/20/2014 1310   HCO3 22.0 04/20/2014 1310   TCO2 23.0 04/20/2014 1310   ACIDBASEDEF 1.3 04/20/2014 1310   O2SAT 96.4 04/20/2014 1310    CXR: PORTABLE CHEST - 1 VIEW  COMPARISON: 04/20/2014  FINDINGS:  Cardiac shadow is stable. Persistent right upper lobe collapse  secondary to the right peritracheal mass lesion is noted. Bilateral  pleural effusions  are seen. Left lower lobe consolidation is seen  stable from the prior exam. No new focal abnormality is seen.  IMPRESSION:  Stable appearance of the chest from the prior day appear  Electronically Signed  By: Inez Catalina M.D.  On: 04/21/2014 07:14   Assessment/Plan: S/P Procedure(s) (LRB): INTRAOPERATIVE TRANSESOPHAGEAL ECHOCARDIOGRAM (N/A) VIDEO BRONCHOSCOPY SUBXYPHOID PERICARDIAL WINDOW (N/A)  Stable s/p pericardial window Path results from endobronchial biopsies pending Plan per medical team   [facility] 04/21/2014 10:32 AM

## 2014-04-21 NOTE — Progress Notes (Signed)
04/21/2014 4:14 PM Nursing note IV team evaluated IV site, great blood return noted from site. No interventions needed at this time. Pt. Updated on plan of care. Will continue to closely monitor patient.  Brook Geraci, Arville Lime

## 2014-04-22 ENCOUNTER — Encounter (HOSPITAL_COMMUNITY): Payer: Medicare Other

## 2014-04-22 ENCOUNTER — Encounter (HOSPITAL_COMMUNITY): Payer: Self-pay | Admitting: Cardiothoracic Surgery

## 2014-04-22 ENCOUNTER — Inpatient Hospital Stay (HOSPITAL_COMMUNITY): Payer: Medicare Other

## 2014-04-22 DIAGNOSIS — I319 Disease of pericardium, unspecified: Secondary | ICD-10-CM

## 2014-04-22 LAB — BASIC METABOLIC PANEL
Anion gap: 16 — ABNORMAL HIGH (ref 5–15)
BUN: 22 mg/dL (ref 6–23)
CHLORIDE: 92 meq/L — AB (ref 96–112)
CO2: 21 meq/L (ref 19–32)
Calcium: 8.5 mg/dL (ref 8.4–10.5)
Creatinine, Ser: 0.85 mg/dL (ref 0.50–1.35)
GFR calc Af Amer: >90 mL/min (ref >90)
GFR calc non Af Amer: 85 mL/min — ABNORMAL LOW (ref >90)
GLUCOSE: 109 mg/dL — AB (ref 70–99)
POTASSIUM: 3.9 meq/L (ref 3.7–5.3)
Sodium: 129 mEq/L — ABNORMAL LOW (ref 137–147)

## 2014-04-22 LAB — GLUCOSE, CAPILLARY
GLUCOSE-CAPILLARY: 120 mg/dL — AB (ref 70–99)
GLUCOSE-CAPILLARY: 110 mg/dL — AB (ref 70–99)
Glucose-Capillary: 111 mg/dL — ABNORMAL HIGH (ref 70–99)

## 2014-04-22 LAB — TISSUE CULTURE: CULTURE: NO GROWTH

## 2014-04-22 LAB — MAGNESIUM: Magnesium: 2.2 mg/dL (ref 1.5–2.5)

## 2014-04-22 LAB — CBC
HEMATOCRIT: 33.9 % — AB (ref 39.0–52.0)
HEMOGLOBIN: 11.4 g/dL — AB (ref 13.0–17.0)
MCH: 31.3 pg (ref 26.0–34.0)
MCHC: 33.6 g/dL (ref 30.0–36.0)
MCV: 93.1 fL (ref 78.0–100.0)
Platelets: 308 10*3/uL (ref 150–400)
RBC: 3.64 MIL/uL — ABNORMAL LOW (ref 4.22–5.81)
RDW: 14.4 % (ref 11.5–15.5)
WBC: 16.1 10*3/uL — AB (ref 4.0–10.5)

## 2014-04-22 LAB — PROTIME-INR
INR: 1.26 (ref 0.00–1.49)
PROTHROMBIN TIME: 15.8 s — AB (ref 11.6–15.2)

## 2014-04-22 LAB — HEPARIN LEVEL (UNFRACTIONATED): Heparin Unfractionated: 0.36 IU/mL (ref 0.30–0.70)

## 2014-04-22 MED ORDER — AMIODARONE HCL IN DEXTROSE 360-4.14 MG/200ML-% IV SOLN
60.0000 mg/h | INTRAVENOUS | Status: AC
  Administered 2014-04-22 (×2): 60 mg/h via INTRAVENOUS
  Filled 2014-04-22: qty 200

## 2014-04-22 MED ORDER — AMIODARONE HCL IN DEXTROSE 360-4.14 MG/200ML-% IV SOLN
30.0000 mg/h | INTRAVENOUS | Status: DC
  Administered 2014-04-22 – 2014-04-23 (×2): 30 mg/h via INTRAVENOUS
  Filled 2014-04-22 (×5): qty 200

## 2014-04-22 MED ORDER — DIGOXIN 0.25 MG/ML IJ SOLN
0.2500 mg | Freq: Once | INTRAMUSCULAR | Status: AC
  Administered 2014-04-22: 0.25 mg via INTRAVENOUS
  Filled 2014-04-22: qty 1

## 2014-04-22 MED ORDER — WARFARIN SODIUM 7.5 MG PO TABS
7.5000 mg | ORAL_TABLET | Freq: Once | ORAL | Status: AC
  Administered 2014-04-22: 7.5 mg via ORAL
  Filled 2014-04-22 (×2): qty 1

## 2014-04-22 NOTE — Progress Notes (Signed)
Echo Lab  Limited 2D Echocardiogram completed.  Willard, RDCS 04/22/2014 10:00 AM

## 2014-04-22 NOTE — Progress Notes (Signed)
Pt heart rate increased to 130's to 140's back in a-fib. Spoke with Dillon Willis from cardiology who was on the floor and she advised to increase Amio IV up to 60 mg per hour and reevaluate patient in 30 minutes to see if heart rate improves. Increased rate on pump per her instructions.

## 2014-04-22 NOTE — Progress Notes (Signed)
PULMONARY / CRITICAL CARE MEDICINE   Name: Dillon Willis MRN: [id number] DOB: 12/02/1940    ADMISSION DATE:  05/08/2014  PRIMARY SERVICE: PCCM  PCP : Dr. Nyra Capes   CHIEF COMPLAINT:  Severe shortness of breath and dizziness   BRIEF PATIENT DESCRIPTION: 15 yowm smoker referred to office 04/17/14 for intermittent hemoptysis  And RUL lung mass complicated by moderate pericardial effusion and hyponatremia and hypotension.    SIGNIFICANT EVENTS / STUDIES:  CTA chest 04/17/14 >neg PE, thrombus within the superior vena cava secondary to mass effect from the large right paratracheal mass. Venous return from the head and upper thorax is via collaterals.  The pericardial effusion has increased and is now moderate in size with maximal thickness of 2.6 cm. The small bilateral pleural effusions have increased.  total right upper lobe collapse. There is subsegmental atelectasis on the left. . There is bilateral adrenal enlargement 2D Echo07/18/2015 >>> tamponade. 7/2 emergent pericardial window and bronchoscopy with RUL mass biopsy by Dr. Nils Pyle 2D Echo post  Window > no more tamponade, RA clot, SVC clot, LVEF 50-55% 7/4 AFib with RVR, ICU transfer held  LINES / TUBES: 7/2 Sub xyphoid pericardial tube >>7/4  CULTURES: 7/2 pericardial fluid >>neg 7/2 pericardial tissue>>>  ANTIBIOTICS: 7/2 periop cefuroxime >>  SUBJECTIVE: Back in Afib with RVR. C/o some chest tightness, SOB.   VITAL SIGNS: Temp:  [98 F (36.7 C)-98.1 F (36.7 C)] 98 F (36.7 C) (07/06 0440) Pulse Rate:  [70-118] 118 (07/06 0628) Resp:  [18-22] 18 (07/06 0628) BP: (105-136)/(65-66) 105/66 mmHg (07/06 0628) SpO2:  [94 %-100 %] 94 % (07/06 0628) Weight:  [172 lb (78.019 kg)] 172 lb (78.019 kg) (07/06 0440) HEMODYNAMICS:   VENTILATOR SETTINGS:   INTAKE / OUTPUT: Intake/Output     07/05 0701 - 07/06 0700 07/06 0701 - 07/07 0700   P.O. 240    I.V. (mL/kg) 137.1 (1.8)    Total Intake(mL/kg) 377.1 (4.8)    Urine (mL/kg/hr)  1575 (0.8)    Stool 1 (0)    Total Output 1576     Net -1198.9            PHYSICAL EXAMINATION:  Gen: resting in bed, NAD HEENT: NCAT, EOMi, OP clear,  PULM: resps even, non labored on RA, bronchial breath sounds RUL, CTA otherwise CV: irreg, Afib, no mgr, no JVD AB: BS+, soft, nontender, no hsm Ext: warm, no edema, no clubbing, no cyanosis Neuro: A&Ox4, MAEW   LABS:  CBC  Recent Labs Lab 04/20/14 0539 04/21/14 0246 04/22/14 0332  WBC 20.5* 19.7* 16.1*  HGB 12.6* 11.6* 11.4*  HCT 36.5* 35.1* 33.9*  PLT 342 331 308   Coag's  Recent Labs Lab 04/22/14 0332  INR 1.26   BMET  Recent Labs Lab 04/20/14 0539 04/21/14 0246 04/22/14 0332  NA 130* 130* 129*  K 4.7 4.4 3.9  CL 95* 94* 92*  CO2 21 21 21   BUN 24* 24* 22  CREATININE 0.80 0.88 0.85  GLUCOSE 107* 118* 109*   Electrolytes  Recent Labs Lab 04/24/2014 1030  04/20/14 0539 04/21/14 0246 04/22/14 0332  CALCIUM 9.9  < > 8.7 8.7 8.5  MG 2.6*  --   --   --   --   PHOS 4.7*  --   --   --   --   < > = values in this interval not displayed. Sepsis Markers No results found for this basename: LATICACIDVEN, PROCALCITON, O2SATVEN,  in the last 168 hours ABG  Recent Labs Lab 04/20/14 1310  PHART 7.468*  PCO2ART 30.7*  PO2ART 66.5*   Liver Enzymes  Recent Labs Lab 04/23/2014 1030 04/20/14 0539  AST 19 16  ALT 15 15  ALKPHOS 104 93  BILITOT 0.7 0.7  ALBUMIN 3.4* 2.5*   Cardiac Enzymes  Recent Labs Lab 04/17/14 1715 05/02/2014 1030  PROBNP 1390.0* 1408.0*   Glucose  Recent Labs Lab 04/20/14 2003 04/21/14 0731 04/21/14 1116 04/21/14 1618 04/21/14 2056 04/22/14 0605  GLUCAP 131* 126* 122* 109* 158* 120*    Imaging   7/5 CXR:  RLL collapse, RUL Mass/collapse, LLL effusion vs atelectasis  ASSESSMENT / PLAN:  PULMONARY A: Large RUL mass--  worrisome for malignancy -?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  Afib with RVR - initially back in NSR with amio - recurrent Afib RVR 7/6 P:  Cardiology following  Amiodarone per cards - increased 7/6 F/u path from pericardial biopsy Tele  Heparin gtt/ coumadin per pharmacy   RENAL A:  Hyponatremia improving, likely lung mass related P:  KVO fluids Monitor electrolytes closely and replete as needed  GASTROINTESTINAL A:  No acute issues P:   Diet as tolerated  HEMATOLOGIC A:  Leukocytosis - likely stress reaction + hydrocortisone.  Improving.  P:  Monitor CBC  INFECTIOUS A:  No acute infectious issues P:   Monitor wbc/temp tr   ENDOCRINE A:  Hyperglycemia  P:   SSI   NEUROLOGIC A:  Intact  P:   monitor   Nickolas Madrid, NP 04/22/2014  9:17 AM Pager: [phone] or [phone]  *Care during the described time interval was provided by me and/or other providers on the critical care team. I have reviewed this patient's available data, including medical history, events of note, physical examination and test results as part of my evaluation.   Attending:  I have seen and examined the patient with nurse practitioner/resident and agree with the note above.   Back in afib, symptomatic Repeat echo today Appreciate cardiology Once out of afib or we have afib dispo he is ready for discharge.  [name], MD [facility] Pager: [phone] Cell: [phone] If no response, call [phone]

## 2014-04-22 NOTE — Progress Notes (Signed)
ANTICOAGULATION CONSULT NOTE - Follow Up Consult  Pharmacy Consult for Heparin and Coumadin Indication: clot in SVC and right atrium  No Known Allergies  Patient Measurements: Height: 5\' 10"  (177.8 cm) Weight: 172 lb (78.019 kg) IBW/kg (Calculated) : 73 Heparin Dosing Weight: 79kg  Vital Signs: Temp: 98 F (36.7 C) (07/06 0440) Temp src: Oral (07/06 0440) BP: 105/66 mmHg (07/06 0628) Pulse Rate: 118 (07/06 0628)  Labs:  Recent Labs  04/20/14 0539  04/20/14 2236 04/21/14 0246 04/22/14 0332  HGB 12.6*  --   --  11.6* 11.4*  HCT 36.5*  --   --  35.1* 33.9*  PLT 342  --   --  331 308  LABPROT  --   --   --   --  15.8*  INR  --   --   --   --  1.26  HEPARINUNFRC <0.10*  < > 0.55 0.40 0.36  CREATININE 0.80  --   --  0.88 0.85  < > = values in this interval not displayed.  Estimated Creatinine Clearance: 81.1 ml/min (by C-G formula based on Cr of 0.85).   Medications:  Heparin @ 1900 units/hr  Assessment: 72yom continues on heparin for clot in the SVC and right atrium 2/2 malignancy. Heparin level remains therapeutic. INR is subtherapeutic after one dose of Coumadin yesterday. Will need to continue to watch for drug interaction with amiodarone, especially given dose increase today.  H/H and plts are stable and no bleeding noted.   Goal of Therapy:  INR 2-3 Heparin level 0.3-0.7 units/ml Monitor platelets by anticoagulation protocol: Yes   Plan:  1) Continue heparin at 1900 units/hr 2) Coumadin 7.5mg  x 1 3) Coumadin education once out of ICU 4) Daily heparin level, INR, and CBC  Thank you, Vivia Ewing, PharmD Clinical Pharmacist - Resident Pager: [phone] Pharmacy: [phone] 04/22/2014 9:46 AM

## 2014-04-22 NOTE — Progress Notes (Addendum)
      [address]       [place] [address]             [phone]       4 Days Post-Op Procedure(s) (LRB): INTRAOPERATIVE TRANSESOPHAGEAL ECHOCARDIOGRAM (N/A) VIDEO BRONCHOSCOPY SUBXYPHOID PERICARDIAL WINDOW (N/A)  Subjective: Patient without complaints.  Objective: Vital signs in last 24 hours: Temp:  [98 F (36.7 C)-98.1 F (36.7 C)] 98 F (36.7 C) (07/06 0440) Pulse Rate:  [70-118] 118 (07/06 0628) Cardiac Rhythm:  [-] Normal sinus rhythm (07/06 0833) Resp:  [18-31] 18 (07/06 0628) BP: (105-136)/(65-66) 105/66 mmHg (07/06 0628) SpO2:  [94 %-100 %] 94 % (07/06 0628) Weight:  [172 lb (78.019 kg)] 172 lb (78.019 kg) (07/06 0440)      Intake/Output from previous day: 07/05 0701 - 07/06 0700 In: 377.1 [P.O.:240; I.V.:137.1] Out: 1576 [Urine:1575; Stool:1]   Physical Exam:  Cardiovascular: IRRR IRRR Pulmonary: Clear to auscultation bilaterally; no rales, wheezes, or rhonchi. Wounds: Clean and dry.  No erythema or signs of infection.   Lab Results: CBC: Recent Labs  04/21/14 0246 04/22/14 0332  WBC 19.7* 16.1*  HGB 11.6* 11.4*  HCT 35.1* 33.9*  PLT 331 308   BMET:  Recent Labs  04/21/14 0246 04/22/14 0332  NA 130* 129*  K 4.4 3.9  CL 94* 92*  CO2 21 21  GLUCOSE 118* 109*  BUN 24* 22  CREATININE 0.88 0.85  CALCIUM 8.7 8.5    PT/INR:  Recent Labs  04/22/14 0332  LABPROT 15.8*  INR 1.26   ABG:  INR: Will add last result for INR, ABG once components are confirmed Will add last 4 CBG results once components are confirmed  Assessment/Plan:  1. CV - A fib with RVR. On Amiodarone and drips. S/p subxiphoid pericardial window. Cultures showed no growth. 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 2. H and H stable at 11.4 and 33.9 3. Hyponatremia 129 4. Management per medicine, cardiology  ZIMMERMAN,DONIELLE MPA-C 04/22/2014,8:53 AM  path pending on RUL and pericardial tissue Coumadin per pharmacy for R atrial  thrombus  patient examined and medical record reviewed,agree with above note. VAN TRIGT III,Deijah Spikes 04/22/2014

## 2014-04-22 NOTE — Care Management Note (Signed)
    Page 1 of 1   04/26/2014     4:51:32 PM CARE MANAGEMENT NOTE 04/26/2014  Patient:  Dillon Willis, Dillon Willis   Account Number:  [id number]  Date Initiated:  04/22/2014  Documentation initiated by:  Randolf Sansoucie  Subjective/Objective Assessment:   Pt s/p subxiphoid pericardial window on 05/16/2014. New dx of lung mass.   PTA, pt independent, resides at home with supportive son.     Action/Plan:   Will cont to follow for dc needs as pt progresses.   Anticipated DC Date:  04/26/2014   Anticipated DC Plan:  [place]  CM consult      Choice offered to / List presented to:             Status of service:  In process, will continue to follow Medicare Important Message given?  YES (If response is "NO", the following Medicare IM given date fields will be blank) Date Medicare IM given:  04/22/2014 Medicare IM given by:  Ludmila Ebarb Date Additional Medicare IM given:  04/26/2014 Additional Medicare IM given by:  Dashay Giesler  Discharge Disposition:  ACUTE TO ACUTE TRANS  Per UR Regulation:  Reviewed for med. necessity/level of care/duration of stay  If discussed at [place] of Stay Meetings, dates discussed:   04/23/2014  04/25/2014    Comments:  04/26/14 Ashley Murrain, BSN [phone] Pt tranferred to [facility] today for radiation therapy.

## 2014-04-22 NOTE — Progress Notes (Signed)
Patient: Dillon Willis / Admit Date: 04/22/2014 / Date of Encounter: 04/22/2014, 8:47 AM   Subjective: Feels sensation of harder, faster HR. Breathing feels slightly more labored. Mild chest pressure (but has had this through hospitalization).   Objective: Telemetry: went back into rapid ?atrial flutter vs coarse AF this AM 160s with brief NSVT (5-7 beats) -> amio increased from 30 to 60mg /hr -> HR 120s  Physical Exam: Blood pressure 105/66, pulse 118, temperature 98 F (36.7 C), temperature source Oral, resp. rate 18, height 5\' 10"  (1.778 m), weight 172 lb (78.019 kg), SpO2 94.00%. General: Well developed, well nourished WM in no acute distress. Head: Normocephalic, atraumatic, sclera non-icteric, no xanthomas, nares are without discharge. Neck: JVP moderately elevated. Lungs: Diminished at bases. Otherwise no wheezes, rales, or rhonchi. Breathing is unlabored. Heart: Irregular, tachycardic, somewhat distant, S1 S2 without murmurs, rubs, or gallops.  Abdomen: Soft, non-tender, non-distended with normoactive bowel sounds. No rebound/guarding. Extremities: No clubbing or cyanosis. No edema. Distal pedal pulses are 2+ and equal bilaterally. Neuro: Alert and oriented X 3. Moves all extremities spontaneously. Psych:  Responds to questions appropriately with a normal affect.   Intake/Output Summary (Last 24 hours) at 04/22/14 0847 Last data filed at 04/22/14 0442  Gross per 24 hour  Intake  331.4 ml  Output   1576 ml  Net -1244.6 ml    Inpatient Medications:  . acetaminophen  1,000 mg Oral 4 times per day   Or  . acetaminophen (TYLENOL) oral liquid 160 mg/5 mL  1,000 mg Oral 4 times per day  . bisacodyl  10 mg Oral Daily  . docusate sodium  100 mg Oral BID  . insulin aspart  0-15 Units Subcutaneous TID WC  . montelukast  10 mg Oral q morning - 10a  . senna-docusate  1 tablet Oral QHS  . Warfarin - Pharmacist Dosing Inpatient   Does not apply q1800   Infusions:  . amiodarone 60  mg/hr (04/22/14 0654)  . dextrose 5 % and 0.9% NaCl 10 mL/hr at 04/21/14 1000  . heparin 1,900 Units/hr (04/21/14 2207)    Labs:  Recent Labs  04/21/14 0246 04/22/14 0332  NA 130* 129*  K 4.4 3.9  CL 94* 92*  CO2 21 21  GLUCOSE 118* 109*  BUN 24* 22  CREATININE 0.88 0.85  CALCIUM 8.7 8.5    Recent Labs  04/20/14 0539  AST 16  ALT 15  ALKPHOS 93  BILITOT 0.7  PROT 6.0  ALBUMIN 2.5*    Recent Labs  04/21/14 0246 04/22/14 0332  WBC 19.7* 16.1*  HGB 11.6* 11.4*  HCT 35.1* 33.9*  MCV 94.1 93.1  PLT 331 308   No results found for this basename: CKTOTAL, CKMB, TROPONINI,  in the last 72 hours No components found with this basename: POCBNP,  No results found for this basename: HGBA1C,  in the last 72 hours   Radiology/Studies:  Ct Angio Chest Pe W/cm &/or Wo Cm  04/17/2014   CLINICAL DATA:  Severe shortness of breath especially with exertion, hypotension, vomiting. The patient's creatinine is 1.8 and the GFR is 42.  EXAM: CT ANGIOGRAPHY CHEST WITH CONTRAST  TECHNIQUE: Multidetector CT imaging of the chest was performed using the standard protocol during bolus administration of intravenous contrast. Multiplanar CT image reconstructions and MIPs were obtained to evaluate the vascular anatomy.  CONTRAST:  122mL OMNIPAQUE IOHEXOL 350 MG/ML SOLN intravenously.  COMPARISON:  PA chest x-ray dated April 15, 2014 and noncontrast CT scan of chest  dated April 12, 2014.  FINDINGS: Again demonstrated is the large mass in the right paratracheal region extending into the right hilum. This is producing mass effect upon the superior vena cava, and there is thrombus within the superior vena cava with venous return from the right upper extremity coursing through chest wall collaterals and the azygos system to reach the inferior vena cava. No filling defects are demonstrated within the pulmonary arterial tree. The caliber of the thoracic aorta is normal. The contrast bolus does not allow assessment  of the presence of a false lumen. The pericardial effusion has increased and is now moderate in size. The cardiac chambers are normal in size. There are coronary artery calcifications. There are enlarged AP window lymph nodes.  There remains total collapse of the right upper lobe. The right middle lobe exhibits at least 1 abnormal nodule which is been previously demonstrated. The right lower lobe is clear. There is subsegmental atelectasis in the left lower lobe. There are small bilateral pleural effusions which have increased in size since the previous study.  Within the upper abdomen there are splenic calcifications consistent with previous granulomatous infection. There is bilateral adrenal enlargement. The observed portions of the liver are normal. No acute bony abnormalities are demonstrated.  Review of the MIP images confirms the above findings.  IMPRESSION: 1. There is no acute pulmonary embolism. However, there is thrombus within the superior vena cava secondary to mass effect from the large right paratracheal mass. Venous return from the head and upper thorax is via collaterals. This may have existed previously but was not apparent due to the noncontrast nature of the previous study. 2. The pericardial effusion has increased and is now moderate in size with maximal thickness of 2.6 cm. The small bilateral pleural effusions have increased. 3. There remains total right upper lobe collapse. There is subsegmental atelectasis on the left. 4. There is bilateral adrenal enlargement. 5. These results were called by telephone at the time of interpretation on 04/17/2014 at 6:18 PM to Dr. Halford Chessman, who verbally acknowledged these results.   Electronically Signed   By: David  Martinique   On: 04/17/2014 18:21   Dg Chest Port 1 View  04/21/2014   CLINICAL DATA:  Pericardial window procedure  EXAM: PORTABLE CHEST - 1 VIEW  COMPARISON:  04/20/2014  FINDINGS: Cardiac shadow is stable. Persistent right upper lobe collapse secondary  to the right peritracheal mass lesion is noted. Bilateral pleural effusions are seen. Left lower lobe consolidation is seen stable from the prior exam. No new focal abnormality is seen.  IMPRESSION: Stable appearance of the chest from the prior day appear   Electronically Signed   By: Inez Catalina M.D.   On: 04/21/2014 07:14   Dg Chest Port 1 View  04/20/2014   CLINICAL DATA:  Shortness of breath. Pericardial window procedure 2 days ago.  EXAM: PORTABLE CHEST - 1 VIEW 1331 hr  COMPARISON:  Portable chest x-rays earlier same 0453 hr and dating back to 05/09/2014. Two-view chest x-ray 04/15/2014. CT chest 04/12/2014.  FINDINGS: Atelectasis involving the right upper lobe and right upper lobe lung mass as noted on prior examinations, unchanged. Bilateral pleural effusions, left greater than right, and associated dense consolidation in the left lower lobe, unchanged since earlier today in yesterday, but worse than on the examination 2 days ago. No new pulmonary parenchymal abnormalities. Cardiac silhouette upper normal in size to slightly enlarged for technique. Pulmonary vascularity normal without evidence of pulmonary edema.  IMPRESSION: 1. Stable  right upper lobe atelectasis and right upper lobe lung mass. 2. Bilateral pleural effusions, left greater than right, with and associated dense passive atelectasis and/or pneumonia in the left lower lobe, stable since yesterday but increased since 2 days ago. 3. No new abnormalities since yesterday.   Electronically Signed   By: Evangeline Dakin M.D.   On: 04/20/2014 13:56   Dg Chest Port 1 View  04/20/2014   CLINICAL DATA:  Cardiac tamponade  EXAM: PORTABLE CHEST - 1 VIEW  COMPARISON:  Yesterday.  FINDINGS: Stable enlargement of the cardiac silhouette, left basilar airspace opacity, bilateral pleural fluid and right upper lobe collapse. Stable calcified granuloma at the right lung base. Lower thoracic spine degenerative changes.  IMPRESSION: 1. Stable cardiomegaly, left  lower lobe atelectasis or pneumonia, bilateral pleural effusions and right upper lobe collapse. 2. No acute abnormality.   Electronically Signed   By: Enrique Sack M.D.   On: 04/20/2014 07:09   Dg Chest Port 1 View  04/19/2014   CLINICAL DATA:  post op pericardial window  EXAM: PORTABLE CHEST - 1 VIEW  COMPARISON:  Portal chest radiograph dated 05/05/2014  FINDINGS: Cardiac silhouette stable. Pericardial drain inferior aspect of the heart stable. Postobstructive right upper collapsed due to a central mediastinal mass stable. Persistent density left retrocardiac region. The aerated portion of the lungs are clear. Stable calcified granuloma right lung base. There is blunting of the left costophrenic angle. Mild degenerative changes in the shoulders.  IMPRESSION: Stable chest radiograph.   Electronically Signed   By: Margaree Mackintosh M.D.   On: 04/19/2014 08:01   Dg Chest Port 1 View  05/12/2014   CLINICAL DATA:  Postop pericardial window. Mediastinal mass and SVC obstruction.  EXAM: PORTABLE CHEST - 1 VIEW  COMPARISON:  Radiographs 04/15/2014.  CT 04/17/2014.  FINDINGS: 1841 hr. A drain is noted over the heart consistent with a pericardial drain. The cardiac silhouette is not significantly decreased in size. There is increased retrocardiac pulmonary opacity. Left pleural effusion appears about the same. There is no pneumothorax. The large mediastinal mass and resulting right upper lobe collapse are unchanged.  IMPRESSION: Interval pericardial drain placement without demonstrated complication. There is increased left lower lobe airspace disease which may reflect atelectasis or aspiration. The mediastinal mass and right upper lobe collapse have not significantly changed.   Electronically Signed   By: Camie Patience M.D.   On: 04/28/2014 19:01   Dg Chest Port 1 View  05/03/2014   CLINICAL DATA:  Preop exam for pericardial window. Respiratory distress.  EXAM: PORTABLE CHEST - 1 VIEW  COMPARISON:  CT, 04/17/2014 and chest  radiograph, 04/15/2014  FINDINGS: Right peritracheal/upper lobe mass and right upper lobe atelectasis is without change. There is some opacity at the left lung base that is likely atelectasis. No edema or convincing pneumonia. Cardiopericardial silhouette is mildly enlarged but also unchanged.  Bony thorax is demineralized but grossly intact.  IMPRESSION: Stable appearance from the previous chest radiograph and chest CT. No acute findings in the lungs.   Electronically Signed   By: Lajean Manes M.D.   On: 05/07/2014 16:37     Assessment and Plan  1. RUL lung mass suspected lung cancer (presented with intermittent hemoptysis), pathology pending from bx 2. SVC syndrome with thrombus occluding, on heparin -> Coumadin 3. Large pericardial effusion with cardiac tamponade s/p pericardial window 7/3 4. Bilateral adrenal enlargement - cortisol level high at 109.9 on 7/3 5. Paroxysmal atrial fibrillation/possible flutter 6. Hypotension, improved off  antihypertensives 7. Hyponatremia, ? r/t lung disease 8. NSVT  With recurrent arrhythmia this AM and muffled heart sounds, will recheck stat limited echo just to ensure stability. (Quick bedside echo 04/20/14 showed no reaccumulation of effusion.) Suspect atrial flutter vs coarse atrial fib - HR 120s-160s with NSVT, BP stable. K 3.9. Check Mg. He has not been on any beta blockers due to hypotension during this admission. Per d/w MD, will leave amiodarone at 60mg /hr for 6 hrs then reduce back to 30mg /hr, and give digoxin 0.25mg  IV x 1.   TSH normal but cortisol level prior to hydrocortisone administration this admission was high ? relationship to adrenal enlargement, will defer to primary team.   Signed, Melina Copa PA-C   The patient was seen, examined and discussed with Melina Copa, PA-C and I agree with the above.   A very complex patient with newly diagnosed lung mass, pericardial effusion with tamponade, s/p pericardial window on 04/19/14. The patient  developed paroxysmal a-fib with RVR on 04/19/14 on amiodarone infusion since then, cardioverted to SR, but went back to a-flutter with variable block and RVR (145 BPM) at 6 am. The patient developed SOB that is improving with improving HR. Amiodarone drip rate was increased from 30 to 60 mg/hr.  We are going to add Digoxin 0.25 mg iv x 1, followed by 0.125 mg iv daily.  Bedside echocardiogram showed only minimal pericardial effusion and very large left pleural affusion. BP 105 mmHg.  The patient appears hemodynamically stable.   The patient also has runs of nsVT on telemetry. For now we will continue loading with amiodarone. Once the patient cardioverts into SR we should perform risk stratification and rule out ischemia especially since the patient might require lung surgery.   Dorothy Spark 04/22/2014

## 2014-04-23 LAB — GLUCOSE, CAPILLARY
GLUCOSE-CAPILLARY: 96 mg/dL (ref 70–99)
Glucose-Capillary: 124 mg/dL — ABNORMAL HIGH (ref 70–99)
Glucose-Capillary: 97 mg/dL (ref 70–99)
Glucose-Capillary: 126 mg/dL — ABNORMAL HIGH (ref 70–99)

## 2014-04-23 LAB — CBC
HEMATOCRIT: 34.2 % — AB (ref 39.0–52.0)
HEMOGLOBIN: 11.6 g/dL — AB (ref 13.0–17.0)
MCH: 31.4 pg (ref 26.0–34.0)
MCHC: 33.9 g/dL (ref 30.0–36.0)
MCV: 92.4 fL (ref 78.0–100.0)
Platelets: 341 10*3/uL (ref 150–400)
RBC: 3.7 MIL/uL — ABNORMAL LOW (ref 4.22–5.81)
RDW: 14.4 % (ref 11.5–15.5)
WBC: 16.7 10*3/uL — ABNORMAL HIGH (ref 4.0–10.5)

## 2014-04-23 LAB — PROTIME-INR
INR: 1.77 — ABNORMAL HIGH (ref 0.00–1.49)
Prothrombin Time: 20.6 seconds — ABNORMAL HIGH (ref 11.6–15.2)

## 2014-04-23 LAB — HEPARIN LEVEL (UNFRACTIONATED)
HEPARIN UNFRACTIONATED: 0.2 [IU]/mL — AB (ref 0.30–0.70)
Heparin Unfractionated: 0.38 IU/mL (ref 0.30–0.70)

## 2014-04-23 MED ORDER — COUMADIN BOOK
Freq: Once | Status: DC
  Filled 2014-04-23: qty 1

## 2014-04-23 MED ORDER — AMIODARONE HCL 200 MG PO TABS
200.0000 mg | ORAL_TABLET | Freq: Two times a day (BID) | ORAL | Status: DC
  Administered 2014-04-23 – 2014-04-24 (×2): 200 mg via ORAL
  Filled 2014-04-23 (×4): qty 1

## 2014-04-23 MED ORDER — WARFARIN SODIUM 5 MG PO TABS
5.0000 mg | ORAL_TABLET | Freq: Once | ORAL | Status: AC
  Administered 2014-04-23: 5 mg via ORAL
  Filled 2014-04-23: qty 1

## 2014-04-23 MED ORDER — BISOPROLOL FUMARATE 5 MG PO TABS
2.5000 mg | ORAL_TABLET | Freq: Every day | ORAL | Status: DC
  Administered 2014-04-23 – 2014-04-25 (×3): 2.5 mg via ORAL
  Administered 2014-04-26: 11:00:00 via ORAL
  Filled 2014-04-23 (×6): qty 0.5

## 2014-04-23 NOTE — Discharge Instructions (Signed)
Information on my medicine - Coumadin   (Warfarin)  This medication education was reviewed with me or my healthcare representative as part of my discharge preparation.  The pharmacist that spoke with me during my hospital stay was:  Erik Obey, [facility]  Why was Coumadin prescribed for you? Coumadin was prescribed for you because you have a blood clot or a medical condition that can cause an increased risk of forming blood clots. Blood clots can cause serious health problems by blocking the flow of blood to the heart, lung, or brain. Coumadin can prevent harmful blood clots from forming. As a reminder your indication for Coumadin is:   Blood Clotting Disorder  What test will check on my response to Coumadin? While on Coumadin (warfarin) you will need to have an INR test regularly to ensure that your dose is keeping you in the desired range. The INR (international normalized ratio) number is calculated from the result of the laboratory test called prothrombin time (PT).  If an INR APPOINTMENT HAS NOT ALREADY BEEN MADE FOR YOU please schedule an appointment to have this lab work done by your health care provider within 7 days. Your INR goal is usually a number between:  2 to 3 or your provider may give you a more narrow range like 2-2.5.  Ask your health care provider during an office visit what your goal INR is.  What  do you need to  know  About  COUMADIN? Take Coumadin (warfarin) exactly as prescribed by your healthcare provider about the same time each day.  DO NOT stop taking without talking to the doctor who prescribed the medication.  Stopping without other blood clot prevention medication to take the place of Coumadin may increase your risk of developing a new clot or stroke.  Get refills before you run out.  What do you do if you miss a dose? If you miss a dose, take it as soon as you remember on the same day then continue your regularly scheduled regimen the next day.  Do not take two  doses of Coumadin at the same time.  Important Safety Information A possible side effect of Coumadin (Warfarin) is an increased risk of bleeding. You should call your healthcare provider right away if you experience any of the following:   Bleeding from an injury or your nose that does not stop.   Unusual colored urine (red or dark brown) or unusual colored stools (red or black).   Unusual bruising for unknown reasons.   A serious fall or if you hit your head (even if there is no bleeding).  Some foods or medicines interact with Coumadin (warfarin) and might alter your response to warfarin. To help avoid this:   Eat a balanced diet, maintaining a consistent amount of Vitamin K.   Notify your provider about major diet changes you plan to make.   Avoid alcohol or limit your intake to 1 drink for women and 2 drinks for men per day. (1 drink is 5 oz. wine, 12 oz. beer, or 1.5 oz. liquor.)  Make sure that ANY health care provider who prescribes medication for you knows that you are taking Coumadin (warfarin).  Also make sure the healthcare provider who is monitoring your Coumadin knows when you have started a new medication including herbals and non-prescription products.  Coumadin (Warfarin)  Major Drug Interactions  Increased Warfarin Effect Decreased Warfarin Effect  Alcohol (large quantities) Antibiotics (esp. Septra/Bactrim, Flagyl, Cipro) Amiodarone (Cordarone) Aspirin (ASA) Cimetidine (Tagamet)  Megestrol (Megace) NSAIDs (ibuprofen, naproxen, etc.) Piroxicam (Feldene) Propafenone (Rythmol SR) Propranolol (Inderal) Isoniazid (INH) Posaconazole (Noxafil) Barbiturates (Phenobarbital) Carbamazepine (Tegretol) Chlordiazepoxide (Librium) Cholestyramine (Questran) Griseofulvin Oral Contraceptives Rifampin Sucralfate (Carafate) Vitamin K   Coumadin (Warfarin) Major Herbal Interactions  Increased Warfarin Effect Decreased Warfarin Effect  Garlic Ginseng Ginkgo biloba Coenzyme  Q10 Green tea St. Johns wort    Coumadin (Warfarin) FOOD Interactions  Eat a consistent number of servings per week of foods HIGH in Vitamin K (1 serving =  cup)  Collards (cooked, or boiled & drained) Kale (cooked, or boiled & drained) Mustard greens (cooked, or boiled & drained) Parsley *serving size only =  cup Spinach (cooked, or boiled & drained) Swiss chard (cooked, or boiled & drained) Turnip greens (cooked, or boiled & drained)  Eat a consistent number of servings per week of foods MEDIUM-HIGH in Vitamin K (1 serving = 1 cup)  Asparagus (cooked, or boiled & drained) Broccoli (cooked, boiled & drained, or raw & chopped) Brussel sprouts (cooked, or boiled & drained) *serving size only =  cup Lettuce, raw (green leaf, endive, romaine) Spinach, raw Turnip greens, raw & chopped   These websites have more information on Coumadin (warfarin):  FailFactory.se; VeganReport.com.au;

## 2014-04-23 NOTE — Progress Notes (Signed)
Heparin level low this am after resuming last pm; had been very stable at current rate so will not change for now and recheck.  Wynona Neat, PharmD, BCPS 04/23/2014 7:03 AM

## 2014-04-23 NOTE — Progress Notes (Signed)
PULMONARY / CRITICAL CARE MEDICINE   Name: Dillon Willis MRN: [id number] DOB: Mar 31, 1941    ADMISSION DATE:  05/03/2014  PRIMARY SERVICE: PCCM  PCP : Dr. Nyra Capes   CHIEF COMPLAINT:  Severe shortness of breath and dizziness   BRIEF PATIENT DESCRIPTION: 37 yowm smoker referred to office 04/17/14 for intermittent hemoptysis  And RUL lung mass complicated by moderate pericardial effusion and hyponatremia and hypotension.    SIGNIFICANT EVENTS / STUDIES:  CTA chest 04/17/14 >neg PE, thrombus within the superior vena cava secondary to mass effect from the large right paratracheal mass. Venous return from the head and upper thorax is via collaterals.  The pericardial effusion has increased and is now moderate in size with maximal thickness of 2.6 cm. The small bilateral pleural effusions have increased.  total right upper lobe collapse. There is subsegmental atelectasis on the left. . There is bilateral adrenal enlargement  2D Echo07/17/2015 >>> tamponade. 7/2 emergent pericardial window and bronchoscopy with RUL mass biopsy by Dr. Nils Pyle 2D Echo post  Window > no more tamponade, RA clot, SVC clot, LVEF 50-55% 7/4 AFib with RVR, ICU transfer held 7/7 on floor, remains on amio and heparin drip.  LINES / TUBES: 7/2 Sub xyphoid pericardial tube >>7/4  CULTURES: 7/2 pericardial fluid >>neg 7/2 pericardial tissue>>>neg  ANTIBIOTICS: 7/2 periop cefuroxime >>off  SUBJECTIVE:  SOB, not on O2  VITAL SIGNS: Temp:  [97.7 F (36.5 C)] 97.7 F (36.5 C) (07/07 0557) Pulse Rate:  [103-118] 116 (07/07 0557) Resp:  [18] 18 (07/07 0557) BP: (98-136)/(60-83) 136/83 mmHg (07/07 0557) SpO2:  [95 %] 95 % (07/07 0557) HEMODYNAMICS:   VENTILATOR SETTINGS:   INTAKE / OUTPUT: Intake/Output     07/06 0701 - 07/07 0700 07/07 0701 - 07/08 0700   P.O. 480    I.V. (mL/kg)     Total Intake(mL/kg) 480 (6.2)    Urine (mL/kg/hr) 730 (0.4)    Stool     Total Output 730     Net -250            PHYSICAL  EXAMINATION:  Gen: resting in bed, NAD. "I'm very sob and my heart races when I walk." HEENT: NCAT, EOMi, OP clear,  PULM: resps even, non labored on RA, bronchial breath sounds RUL, CTA otherwise, diminished in bases CV: irreg, Afib, no mgr, no JVD AB: BS+, soft, nontender, no hsm Ext: warm, no edema, no clubbing, no cyanosis Neuro: A&Ox4, MAEW   LABS:  CBC  Recent Labs Lab 04/21/14 0246 04/22/14 0332 04/23/14 0519  WBC 19.7* 16.1* 16.7*  HGB 11.6* 11.4* 11.6*  HCT 35.1* 33.9* 34.2*  PLT 331 308 341   Coag's  Recent Labs Lab 04/22/14 0332 04/23/14 0519  INR 1.26 1.77*   BMET  Recent Labs Lab 04/20/14 0539 04/21/14 0246 04/22/14 0332  NA 130* 130* 129*  K 4.7 4.4 3.9  CL 95* 94* 92*  CO2 21 21 21   BUN 24* 24* 22  CREATININE 0.80 0.88 0.85  GLUCOSE 107* 118* 109*   Electrolytes  Recent Labs Lab 05/06/2014 1030  04/20/14 0539 04/21/14 0246 04/22/14 0332  CALCIUM 9.9  < > 8.7 8.7 8.5  MG 2.6*  --   --   --  2.2  PHOS 4.7*  --   --   --   --   < > = values in this interval not displayed. Sepsis Markers No results found for this basename: LATICACIDVEN, PROCALCITON, O2SATVEN,  in the last 168 hours ABG  Recent Labs Lab 04/20/14 1310  PHART 7.468*  PCO2ART 30.7*  PO2ART 66.5*   Liver Enzymes  Recent Labs Lab 05/17/2014 1030 04/20/14 0539  AST 19 16  ALT 15 15  ALKPHOS 104 93  BILITOT 0.7 0.7  ALBUMIN 3.4* 2.5*   Cardiac Enzymes  Recent Labs Lab 04/17/14 1715 05/05/2014 1030  PROBNP 1390.0* 1408.0*   Glucose  Recent Labs Lab 04/21/14 1618 04/21/14 2056 04/22/14 0605 04/22/14 1635 04/22/14 2300 04/23/14 0632  GLUCAP 109* 158* 120* 111* 110* 124*    Imaging Dg Chest 2 View  04/22/2014   CLINICAL DATA:  Lung mass  EXAM: CHEST  2 VIEW  COMPARISON:  04/21/2014  FINDINGS: Small pleural effusions persist. Moderate enlargement of the cardiac silhouette is again noted without evidence for overt edema. Masslike right upper lobe  atelectasis with upper retraction of the minor fissure is reidentified.  IMPRESSION: No significant change in masslike opacification and volume loss involving the right upper lobe.  Stable small pleural effusions and cardiomegaly/ enlargement of the cardiac silhouette.   Electronically Signed   By: Conchita Paris M.D.   On: 04/22/2014 18:03     ASSESSMENT / PLAN:  PULMONARY A: Large RUL mass--  worrisome for malignancy -?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 >S/p endobronchial biopsy on 7/3 - path pending  P: Anticoagulation for SVC syndrome > start warfarin 7/5 F/u path results  Once we know diagnosis will need staging imaging prior to XRT vs chemo Monitor for throat/facial swelling with SVC O2 as needed OOB Incentive spirometry, flutter F/u CXR 7/8 Ambulate in hall on room air 7/7 and document any need for O2.  CARDIOVASCULAR A:  Tamponade - resolved post pericardial window Hypotension - improved.  Afib with RVR - initially back in NSR with amio - recurrent Afib RVR 7/7 with VR 112 P:  Cardiology following  Amiodarone per cards - increased 7/6 F/u path from pericardial biopsy Tele  Heparin gtt/ coumadin per pharmacy   RENAL  Recent Labs Lab 04/20/14 0539 04/21/14 0246 04/22/14 0332  NA 130* 130* 129*    A:  Hyponatremia static, likely lung mass related P:  KVO fluids Monitor electrolytes closely and replete as needed  GASTROINTESTINAL A:  No acute issues P:   Diet as tolerated  HEMATOLOGIC A:  Leukocytosis - likely stress reaction + hydrocortisone.  Improving.  P:  Monitor CBC  INFECTIOUS A:  No acute infectious issues P:   Monitor wbc/temp tr   ENDOCRINE A:  Hyperglycemia  P:   SSI   NEUROLOGIC A:  Intact  P:   monitor  7/7 ambulate in hall and document lowest o2 SAT.   Richardson Landry Minor ACNP [facility] PCCM Pager [phone] till 3 pm If no answer page [phone] 04/23/2014, 8:43 AM  Attending:  I have seen and examined the patient with  nurse practitioner/resident and agree with the note above.   Still waiting for path, will call radiation oncology today in anticipation of path returning  [name], MD [facility] Pager: [phone] Cell: [phone] If no response, call [phone]

## 2014-04-23 NOTE — Progress Notes (Signed)
Patient tolerated ambulation in hallway using rolling walker, gait steady maintained room air O2 sats of 94 to 97 %. Returned to room to sit in Financial trader, encouraged use of incentive Joylene Draft A

## 2014-04-23 NOTE — Progress Notes (Signed)
Patient: Dillon Willis / Admit Date: 04/28/2014 / Date of Encounter: 04/23/2014, 8:19 AM   Subjective: Feeling better today. Feels better in NSR. No acute complaints.    Objective: Telemetry: before converting to NSR at 7:30am, was suspected atrial flutter with intermittent 4-10 beats NSVT Physical Exam: Blood pressure 136/83, pulse 116, temperature 97.7 F (36.5 C), temperature source Oral, resp. rate 18, height 5\' 10"  (1.778 m), weight 172 lb (78.019 kg), SpO2 95.00%. General: Well developed, well nourished WM in no acute distress.  Head: Normocephalic, atraumatic, sclera non-icteric, no xanthomas, nares are without discharge.  Neck: JVP moderately elevated.  Lungs: Slightly diminished at L lung base. Otherwise no wheezes, rales, or rhonchi. Breathing is unlabored.  Heart: RRR, S1 S2 without murmurs, rubs, or gallops.  Abdomen: Soft, non-tender, non-distended with normoactive bowel sounds. No rebound/guarding.  Extremities: No clubbing or cyanosis. No edema. Distal pedal pulses are 2+ and equal bilaterally.  Neuro: Alert and oriented X 3. Moves all extremities spontaneously.  Psych: Responds to questions appropriately with a normal affect.   Intake/Output Summary (Last 24 hours) at 04/23/14 0819 Last data filed at 04/23/14 0700  Gross per 24 hour  Intake    480 ml  Output    730 ml  Net   -250 ml    Inpatient Medications:  . acetaminophen  1,000 mg Oral 4 times per day   Or  . acetaminophen (TYLENOL) oral liquid 160 mg/5 mL  1,000 mg Oral 4 times per day  . bisacodyl  10 mg Oral Daily  . docusate sodium  100 mg Oral BID  . insulin aspart  0-15 Units Subcutaneous TID WC  . montelukast  10 mg Oral q morning - 10a  . senna-docusate  1 tablet Oral QHS  . Warfarin - Pharmacist Dosing Inpatient   Does not apply q1800   Infusions:  . amiodarone 30 mg/hr (04/23/14 0553)  . dextrose 5 % and 0.9% NaCl 10 mL/hr at 04/21/14 1000  . heparin 1,900 Units/hr (04/22/14 2011)     Labs:  Recent Labs  04/21/14 0246 04/22/14 0332  NA 130* 129*  K 4.4 3.9  CL 94* 92*  CO2 21 21  GLUCOSE 118* 109*  BUN 24* 22  CREATININE 0.88 0.85  CALCIUM 8.7 8.5  MG  --  2.2   No results found for this basename: AST, ALT, ALKPHOS, BILITOT, PROT, ALBUMIN,  in the last 72 hours  Recent Labs  04/22/14 0332 04/23/14 0519  WBC 16.1* 16.7*  HGB 11.4* 11.6*  HCT 33.9* 34.2*  MCV 93.1 92.4  PLT 308 341    Radiology/Studies:  Dg Chest 2 View  04/22/2014   CLINICAL DATA:  Lung mass  EXAM: CHEST  2 VIEW  COMPARISON:  04/21/2014  FINDINGS: Small pleural effusions persist. Moderate enlargement of the cardiac silhouette is again noted without evidence for overt edema. Masslike right upper lobe atelectasis with upper retraction of the minor fissure is reidentified.  IMPRESSION: No significant change in masslike opacification and volume loss involving the right upper lobe.  Stable small pleural effusions and cardiomegaly/ enlargement of the cardiac silhouette.   Electronically Signed   By: Conchita Paris M.D.   On: 04/22/2014 18:03   Ct Angio Chest Pe W/cm &/or Wo Cm  04/17/2014   CLINICAL DATA:  Severe shortness of breath especially with exertion, hypotension, vomiting. The patient's creatinine is 1.8 and the GFR is 42.  EXAM: CT ANGIOGRAPHY CHEST WITH CONTRAST  TECHNIQUE: Multidetector CT imaging of  the chest was performed using the standard protocol during bolus administration of intravenous contrast. Multiplanar CT image reconstructions and MIPs were obtained to evaluate the vascular anatomy.  CONTRAST:  158mL OMNIPAQUE IOHEXOL 350 MG/ML SOLN intravenously.  COMPARISON:  PA chest x-ray dated April 15, 2014 and noncontrast CT scan of chest dated April 12, 2014.  FINDINGS: Again demonstrated is the large mass in the right paratracheal region extending into the right hilum. This is producing mass effect upon the superior vena cava, and there is thrombus within the superior vena cava with  venous return from the right upper extremity coursing through chest wall collaterals and the azygos system to reach the inferior vena cava. No filling defects are demonstrated within the pulmonary arterial tree. The caliber of the thoracic aorta is normal. The contrast bolus does not allow assessment of the presence of a false lumen. The pericardial effusion has increased and is now moderate in size. The cardiac chambers are normal in size. There are coronary artery calcifications. There are enlarged AP window lymph nodes.  There remains total collapse of the right upper lobe. The right middle lobe exhibits at least 1 abnormal nodule which is been previously demonstrated. The right lower lobe is clear. There is subsegmental atelectasis in the left lower lobe. There are small bilateral pleural effusions which have increased in size since the previous study.  Within the upper abdomen there are splenic calcifications consistent with previous granulomatous infection. There is bilateral adrenal enlargement. The observed portions of the liver are normal. No acute bony abnormalities are demonstrated.  Review of the MIP images confirms the above findings.  IMPRESSION: 1. There is no acute pulmonary embolism. However, there is thrombus within the superior vena cava secondary to mass effect from the large right paratracheal mass. Venous return from the head and upper thorax is via collaterals. This may have existed previously but was not apparent due to the noncontrast nature of the previous study. 2. The pericardial effusion has increased and is now moderate in size with maximal thickness of 2.6 cm. The small bilateral pleural effusions have increased. 3. There remains total right upper lobe collapse. There is subsegmental atelectasis on the left. 4. There is bilateral adrenal enlargement. 5. These results were called by telephone at the time of interpretation on 04/17/2014 at 6:18 PM to Dr. Halford Chessman, who verbally acknowledged  these results.   Electronically Signed   By: David  Martinique   On: 04/17/2014 18:21   Dg Chest Port 1 View  04/21/2014   CLINICAL DATA:  Pericardial window procedure  EXAM: PORTABLE CHEST - 1 VIEW  COMPARISON:  04/20/2014  FINDINGS: Cardiac shadow is stable. Persistent right upper lobe collapse secondary to the right peritracheal mass lesion is noted. Bilateral pleural effusions are seen. Left lower lobe consolidation is seen stable from the prior exam. No new focal abnormality is seen.  IMPRESSION: Stable appearance of the chest from the prior day appear   Electronically Signed   By: Inez Catalina M.D.   On: 04/21/2014 07:14   Dg Chest Port 1 View  04/20/2014   CLINICAL DATA:  Shortness of breath. Pericardial window procedure 2 days ago.  EXAM: PORTABLE CHEST - 1 VIEW 1331 hr  COMPARISON:  Portable chest x-rays earlier same 0453 hr and dating back to 05/06/2014. Two-view chest x-ray 04/15/2014. CT chest 04/12/2014.  FINDINGS: Atelectasis involving the right upper lobe and right upper lobe lung mass as noted on prior examinations, unchanged. Bilateral pleural effusions, left greater than  right, and associated dense consolidation in the left lower lobe, unchanged since earlier today in yesterday, but worse than on the examination 2 days ago. No new pulmonary parenchymal abnormalities. Cardiac silhouette upper normal in size to slightly enlarged for technique. Pulmonary vascularity normal without evidence of pulmonary edema.  IMPRESSION: 1. Stable right upper lobe atelectasis and right upper lobe lung mass. 2. Bilateral pleural effusions, left greater than right, with and associated dense passive atelectasis and/or pneumonia in the left lower lobe, stable since yesterday but increased since 2 days ago. 3. No new abnormalities since yesterday.   Electronically Signed   By: Evangeline Dakin M.D.   On: 04/20/2014 13:56   Dg Chest Port 1 View  04/20/2014   CLINICAL DATA:  Cardiac tamponade  EXAM: PORTABLE CHEST - 1  VIEW  COMPARISON:  Yesterday.  FINDINGS: Stable enlargement of the cardiac silhouette, left basilar airspace opacity, bilateral pleural fluid and right upper lobe collapse. Stable calcified granuloma at the right lung base. Lower thoracic spine degenerative changes.  IMPRESSION: 1. Stable cardiomegaly, left lower lobe atelectasis or pneumonia, bilateral pleural effusions and right upper lobe collapse. 2. No acute abnormality.   Electronically Signed   By: Enrique Sack M.D.   On: 04/20/2014 07:09   Dg Chest Port 1 View  04/19/2014   CLINICAL DATA:  post op pericardial window  EXAM: PORTABLE CHEST - 1 VIEW  COMPARISON:  Portal chest radiograph dated 04/29/2014  FINDINGS: Cardiac silhouette stable. Pericardial drain inferior aspect of the heart stable. Postobstructive right upper collapsed due to a central mediastinal mass stable. Persistent density left retrocardiac region. The aerated portion of the lungs are clear. Stable calcified granuloma right lung base. There is blunting of the left costophrenic angle. Mild degenerative changes in the shoulders.  IMPRESSION: Stable chest radiograph.   Electronically Signed   By: Margaree Mackintosh M.D.   On: 04/19/2014 08:01   Dg Chest Port 1 View  05/03/2014   CLINICAL DATA:  Postop pericardial window. Mediastinal mass and SVC obstruction.  EXAM: PORTABLE CHEST - 1 VIEW  COMPARISON:  Radiographs 04/15/2014.  CT 04/17/2014.  FINDINGS: 1841 hr. A drain is noted over the heart consistent with a pericardial drain. The cardiac silhouette is not significantly decreased in size. There is increased retrocardiac pulmonary opacity. Left pleural effusion appears about the same. There is no pneumothorax. The large mediastinal mass and resulting right upper lobe collapse are unchanged.  IMPRESSION: Interval pericardial drain placement without demonstrated complication. There is increased left lower lobe airspace disease which may reflect atelectasis or aspiration. The mediastinal mass and  right upper lobe collapse have not significantly changed.   Electronically Signed   By: Camie Patience M.D.   On: 04/20/2014 19:01   Dg Chest Port 1 View  05/10/2014   CLINICAL DATA:  Preop exam for pericardial window. Respiratory distress.  EXAM: PORTABLE CHEST - 1 VIEW  COMPARISON:  CT, 04/17/2014 and chest radiograph, 04/15/2014  FINDINGS: Right peritracheal/upper lobe mass and right upper lobe atelectasis is without change. There is some opacity at the left lung base that is likely atelectasis. No edema or convincing pneumonia. Cardiopericardial silhouette is mildly enlarged but also unchanged.  Bony thorax is demineralized but grossly intact.  IMPRESSION: Stable appearance from the previous chest radiograph and chest CT. No acute findings in the lungs.   Electronically Signed   By: Lajean Manes M.D.   On: 04/29/2014 16:37     Assessment and Plan  1. RUL lung mass  suspected lung cancer (presented with intermittent hemoptysis), pathology pending from bx 7/2 2. 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 Large pericardial effusion with cardiac tamponade s/p pericardial window 7/3, trivial by relook 7/6 4. Bilateral adrenal enlargement - cortisol level high at 109.9 on 7/3, further per IM 5. Paroxysmal atrial fibrillation/possible flutter  6. Hypotension, stable 7. Hyponatremia, ? r/t lung disease  8. NSVT 9. Left pleural effusion, suspect related to #1, large by echo but less impressive by physical exam  In & out of AF/flutter this admission - went into what was suspected rapid atrial flutter yesterday AM with runs of short NSVT and was placed back on IV amiodarone.  Suspect arrhythmia precipitated by acute medical issues. He may also need further eval of high baseline cortisol level. Converted to NSR this AM - will discuss transition to oral amiodarone with MD. May also consider adding low dose bisoprolol given AF/flutter/NSVT since BP has improved.  Signed, Melina Copa  PA-C  I have seen and examined the patient along with Melina Copa PA-C.  I have reviewed the chart, notes and new data.  I agree with PA/NP's note.  Key new complaints: minimally sore today Key examination changes: prominent venous collaterals anterior chest, but no edema or plethora of SVC syndrome Key new findings / data: atypical cells are seen on bronchial washings and the surgical specimen  PLAN: Suspect lung cancer causing SVC syndrome and pericardial effusion - full path report not yet available. Will not be a surgical candidate. Switch amiodarone to PO. BP is now high. Add low dose beta blocker.  Sanda Klein, MD, [place] [phone] 04/23/2014, 1:53 PM

## 2014-04-23 NOTE — Progress Notes (Addendum)
      [address]       [place] [address]             [phone]       5 Days Post-Op Procedure(s) (LRB): INTRAOPERATIVE TRANSESOPHAGEAL ECHOCARDIOGRAM (N/A) VIDEO BRONCHOSCOPY SUBXYPHOID PERICARDIAL WINDOW (N/A)  Subjective: Patient's only complaint is feeling fast heart rate.  Objective: Vital signs in last 24 hours: Temp:  [97.7 F (36.5 C)] 97.7 F (36.5 C) (07/07 0557) Pulse Rate:  [103-118] 116 (07/07 0557) Cardiac Rhythm:  [-] Atrial fibrillation (07/06 1945) Resp:  [18] 18 (07/07 0557) BP: (98-136)/(60-83) 136/83 mmHg (07/07 0557) SpO2:  [95 %] 95 % (07/07 0557)     Intake/Output from previous day: 07/06 0701 - 07/07 0700 In: 480 [P.O.:480] Out: 480 [Urine:480]   Physical Exam:  Cardiovascular: IRRR IRRR Pulmonary: Clear to auscultation bilaterally; no rales, wheezes, or rhonchi. Wounds: Clean and dry.  No erythema or signs of infection.   Lab Results: CBC:  Recent Labs  04/22/14 0332 04/23/14 0519  WBC 16.1* 16.7*  HGB 11.4* 11.6*  HCT 33.9* 34.2*  PLT 308 341   BMET:   Recent Labs  04/21/14 0246 04/22/14 0332  NA 130* 129*  K 4.4 3.9  CL 94* 92*  CO2 21 21  GLUCOSE 118* 109*  BUN 24* 22  CREATININE 0.88 0.85  CALCIUM 8.7 8.5    PT/INR:   Recent Labs  04/23/14 0519  LABPROT 20.6*  INR 1.77*   ABG:  INR: Will add last result for INR, ABG once components are confirmed Will add last 4 CBG results once components are confirmed  Assessment/Plan:  1. CV - A fib with RVR. On Amiodarone and drips. S/p subxiphoid pericardial window. Cultures showed no growth. 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 2. H and H stable at 11.6 and 34.2 3. Management per medicine, cardiology 4. Will arrange for a follow up appointment  ZIMMERMAN,DONIELLE MPA-C 04/23/2014,7:24 AM   patient examined and medical record reviewed,agree with above note. Path still pending special imunostains, INR increasing [facility]AN TRIGT  III,[place] Frieden 04/23/2014

## 2014-04-23 NOTE — Progress Notes (Signed)
ANTICOAGULATION CONSULT NOTE - Follow Up Consult  Pharmacy Consult for Heparin and Coumadin Indication: clot in SVC and right atrium  No Known Allergies  Patient Measurements: Height: 5\' 10"  (177.8 cm) Weight: 174 lb 2.6 oz (79 kg) IBW/kg (Calculated) : 73 Heparin Dosing Weight: 79kg  Vital Signs: Temp: 97.7 F (36.5 C) (07/07 0557) Temp src: Oral (07/07 0557) BP: 136/83 mmHg (07/07 0557) Pulse Rate: 116 (07/07 0557)  Labs:  Recent Labs  04/21/14 0246 04/22/14 0332 04/23/14 0519  HGB 11.6* 11.4* 11.6*  HCT 35.1* 33.9* 34.2*  PLT 331 308 341  LABPROT  --  15.8* 20.6*  INR  --  1.26 1.77*  HEPARINUNFRC 0.40 0.36 0.20*  CREATININE 0.88 0.85  --     Estimated Creatinine Clearance: 81.1 ml/min (by C-G formula based on Cr of 0.85).   Medications:  Heparin @ 1900 units/hr  Assessment: 72yom continues on heparin for clot in the SVC and right atrium 2/2 malignancy. Heparin level low this morning, likely secondary to stop in infusion over night.  Same rate continued when resumed as has been therapeutic on this rate previous.  Heparin level returned therapeutic now. INR remain subtherapeutic, but significantly increased after two doses of 7.5mg . Will backdown on dose slightly today due to signficant jump in INR.  Will need to continue to watch for drug interaction with amiodarone, especially given recent dose increase.  H/H and plts are stable and no bleeding noted.   Goal of Therapy:  INR 2-3 Heparin level 0.3-0.7 units/ml Monitor platelets by anticoagulation protocol: Yes   Plan:  1) Continue heparin at 1900 units/hr 2) Coumadin 5mg  x 1 3) Coumadin education today 4) Daily heparin level, INR, and CBC  Thank you, Vivia Ewing, PharmD Clinical Pharmacist - Resident Pager: [phone] Pharmacy: [phone] 04/23/2014 10:30 AM

## 2014-04-24 ENCOUNTER — Inpatient Hospital Stay (HOSPITAL_COMMUNITY): Payer: Medicare Other

## 2014-04-24 ENCOUNTER — Encounter (HOSPITAL_COMMUNITY): Payer: Self-pay | Admitting: Radiology

## 2014-04-24 LAB — CBC
HEMATOCRIT: 34.8 % — AB (ref 39.0–52.0)
Hemoglobin: 11.5 g/dL — ABNORMAL LOW (ref 13.0–17.0)
MCH: 31.5 pg (ref 26.0–34.0)
MCHC: 33 g/dL (ref 30.0–36.0)
MCV: 95.3 fL (ref 78.0–100.0)
Platelets: 296 10*3/uL (ref 150–400)
RBC: 3.65 MIL/uL — AB (ref 4.22–5.81)
RDW: 14.7 % (ref 11.5–15.5)
WBC: 13.2 10*3/uL — ABNORMAL HIGH (ref 4.0–10.5)

## 2014-04-24 LAB — BASIC METABOLIC PANEL
ANION GAP: 18 — AB (ref 5–15)
BUN: 17 mg/dL (ref 6–23)
CALCIUM: 8.6 mg/dL (ref 8.4–10.5)
CHLORIDE: 94 meq/L — AB (ref 96–112)
CO2: 20 meq/L (ref 19–32)
CREATININE: 0.71 mg/dL (ref 0.50–1.35)
GFR calc Af Amer: >90 mL/min (ref >90)
GFR calc non Af Amer: >90 mL/min (ref >90)
Glucose, Bld: 99 mg/dL (ref 70–99)
Potassium: 3.9 mEq/L (ref 3.7–5.3)
SODIUM: 132 meq/L — AB (ref 137–147)

## 2014-04-24 LAB — GLUCOSE, CAPILLARY: GLUCOSE-CAPILLARY: 101 mg/dL — AB (ref 70–99)

## 2014-04-24 LAB — PROTIME-INR
INR: 2.4 — ABNORMAL HIGH (ref 0.00–1.49)
PROTHROMBIN TIME: 26.2 s — AB (ref 11.6–15.2)

## 2014-04-24 LAB — HEPARIN LEVEL (UNFRACTIONATED): Heparin Unfractionated: 0.43 IU/mL (ref 0.30–0.70)

## 2014-04-24 MED ORDER — PANTOPRAZOLE SODIUM 40 MG PO TBEC
40.0000 mg | DELAYED_RELEASE_TABLET | Freq: Every day | ORAL | Status: DC
  Administered 2014-04-24 – 2014-04-29 (×6): 40 mg via ORAL
  Filled 2014-04-24 (×6): qty 1

## 2014-04-24 MED ORDER — ALUM & MAG HYDROXIDE-SIMETH 200-200-20 MG/5ML PO SUSP
30.0000 mL | Freq: Four times a day (QID) | ORAL | Status: DC | PRN
  Administered 2014-04-24 – 2014-04-25 (×2): 30 mL via ORAL
  Filled 2014-04-24: qty 30

## 2014-04-24 MED ORDER — AMIODARONE HCL IN DEXTROSE 360-4.14 MG/200ML-% IV SOLN
30.0000 mg/h | INTRAVENOUS | Status: DC
  Filled 2014-04-24: qty 200

## 2014-04-24 MED ORDER — AMIODARONE HCL 200 MG PO TABS
200.0000 mg | ORAL_TABLET | Freq: Two times a day (BID) | ORAL | Status: DC
  Administered 2014-04-24 – 2014-04-25 (×2): 200 mg via ORAL
  Filled 2014-04-24 (×4): qty 1

## 2014-04-24 MED ORDER — WARFARIN SODIUM 2.5 MG PO TABS
2.5000 mg | ORAL_TABLET | Freq: Once | ORAL | Status: AC
  Administered 2014-04-24: 2.5 mg via ORAL
  Filled 2014-04-24: qty 1

## 2014-04-24 MED ORDER — ALUM & MAG HYDROXIDE-SIMETH 200-200-20 MG/5ML PO SUSP
15.0000 mL | Freq: Four times a day (QID) | ORAL | Status: DC | PRN
  Filled 2014-04-24: qty 30

## 2014-04-24 MED ORDER — IOHEXOL 300 MG/ML  SOLN
80.0000 mL | Freq: Once | INTRAMUSCULAR | Status: AC | PRN
  Administered 2014-04-24: 80 mL via INTRAVENOUS

## 2014-04-24 MED ORDER — AMIODARONE HCL IN DEXTROSE 360-4.14 MG/200ML-% IV SOLN
60.0000 mg/h | INTRAVENOUS | Status: DC
  Filled 2014-04-24: qty 200

## 2014-04-24 MED ORDER — SODIUM CHLORIDE 0.9 % IV BOLUS (SEPSIS)
350.0000 mL | INTRAVENOUS | Status: AC
  Administered 2014-04-24: 350 mL via INTRAVENOUS

## 2014-04-24 NOTE — Progress Notes (Signed)
PULMONARY / CRITICAL CARE MEDICINE   Name: Dillon Willis MRN: [id number] DOB: March 18, 1941    ADMISSION DATE:  05/01/2014  PRIMARY SERVICE: PCCM  PCP : Dr. Nyra Capes   CHIEF COMPLAINT:  Severe shortness of breath and dizziness   BRIEF PATIENT DESCRIPTION: 33 yowm smoker referred to office 04/17/14 for intermittent hemoptysis  And RUL lung mass complicated by moderate pericardial effusion and hyponatremia and hypotension.    SIGNIFICANT EVENTS / STUDIES:  CTA chest 04/17/14 >neg PE, thrombus within the superior vena cava secondary to mass effect from the large right paratracheal mass. Venous return from the head and upper thorax is via collaterals.  The pericardial effusion has increased and is now moderate in size with maximal thickness of 2.6 cm. The small bilateral pleural effusions have increased.  total right upper lobe collapse. There is subsegmental atelectasis on the left. . There is bilateral adrenal enlargement  2D Echo07/16/2015 >>> tamponade. 7/2 emergent pericardial window and bronchoscopy with RUL mass biopsy by Dr. Nils Pyle 2D Echo post  Window > no more tamponade, RA clot, SVC clot, LVEF 50-55% 7/4 AFib with RVR, ICU transfer held 7/7 on floor, remains on amio and heparin drip.  LINES / TUBES: 7/2 Sub xyphoid pericardial tube >>7/4  CULTURES: 7/2 pericardial fluid >>neg 7/2 pericardial tissue>>>neg  ANTIBIOTICS: 7/2 periop cefuroxime >>off  SUBJECTIVE:  Back in Afib with RVR.  C/o mild SOB.  Anxious for path results.   VITAL SIGNS: Temp:  [97.6 F (36.4 C)-98 F (36.7 C)] 98 F (36.7 C) (07/08 0544) Pulse Rate:  [72-74] 72 (07/08 0544) Resp:  [18] 18 (07/08 0544) BP: (98-146)/(58-75) 98/58 mmHg (07/08 0901) SpO2:  [95 %-100 %] 95 % (07/08 0544) Weight:  [175 lb (79.379 kg)] 175 lb (79.379 kg) (07/08 0544) HEMODYNAMICS:   VENTILATOR SETTINGS:   INTAKE / OUTPUT: Intake/Output     07/07 0701 - 07/08 0700 07/08 0701 - 07/09 0700   P.O. 840    Total Intake(mL/kg)  840 (10.6)    Urine (mL/kg/hr) 1350 (0.7)    Stool 1 (0)    Total Output 1351     Net -511          Urine Occurrence 1 x    Stool Occurrence 1 x      PHYSICAL EXAMINATION:  Gen: NAD OOB in chair  HEENT: NCAT, EOMi, OP clear,  PULM: resps even, non labored on RA, bronchial breath sounds RUL, diminished in bases CV: irreg, Afib, no mgr, no JVD AB: BS+, soft, nontender, no hsm Ext: warm, no edema, no clubbing, no cyanosis Neuro: A&Ox4, MAEW   LABS:  CBC  Recent Labs Lab 04/22/14 0332 04/23/14 0519 04/24/14 0347  WBC 16.1* 16.7* 13.2*  HGB 11.4* 11.6* 11.5*  HCT 33.9* 34.2* 34.8*  PLT 308 341 296   Coag's  Recent Labs Lab 04/22/14 0332 04/23/14 0519 04/24/14 0347  INR 1.26 1.77* 2.40*   BMET  Recent Labs Lab 04/21/14 0246 04/22/14 0332 04/24/14 0347  NA 130* 129* 132*  K 4.4 3.9 3.9  CL 94* 92* 94*  CO2 21 21 20   BUN 24* 22 17  CREATININE 0.88 0.85 0.71  GLUCOSE 118* 109* 99   Electrolytes  Recent Labs Lab 05/15/2014 1030  04/21/14 0246 04/22/14 0332 04/24/14 0347  CALCIUM 9.9  < > 8.7 8.5 8.6  MG 2.6*  --   --  2.2  --   PHOS 4.7*  --   --   --   --   < > =  values in this interval not displayed. Sepsis Markers No results found for this basename: LATICACIDVEN, PROCALCITON, O2SATVEN,  in the last 168 hours ABG  Recent Labs Lab 04/20/14 1310  PHART 7.468*  PCO2ART 30.7*  PO2ART 66.5*   Liver Enzymes  Recent Labs Lab 04/19/2014 1030 04/20/14 0539  AST 19 16  ALT 15 15  ALKPHOS 104 93  BILITOT 0.7 0.7  ALBUMIN 3.4* 2.5*   Cardiac Enzymes  Recent Labs Lab 04/17/14 1715 05/04/2014 1030  PROBNP 1390.0* 1408.0*   Glucose  Recent Labs Lab 04/22/14 2300 04/23/14 0632 04/23/14 1139 04/23/14 1605 04/23/14 2128 04/24/14 0600  GLUCAP 110* 124* 97 126* 96 101*    Imaging Dg Chest 2 View  04/22/2014   CLINICAL DATA:  Lung mass  EXAM: CHEST  2 VIEW  COMPARISON:  04/21/2014  FINDINGS: Small pleural effusions persist. Moderate  enlargement of the cardiac silhouette is again noted without evidence for overt edema. Masslike right upper lobe atelectasis with upper retraction of the minor fissure is reidentified.  IMPRESSION: No significant change in masslike opacification and volume loss involving the right upper lobe.  Stable small pleural effusions and cardiomegaly/ enlargement of the cardiac silhouette.   Electronically Signed   By: Conchita Paris M.D.   On: 04/22/2014 18:03   Dg Chest Port 1 View  04/24/2014   CLINICAL DATA:  Respiratory distress  EXAM: PORTABLE CHEST - 1 VIEW  COMPARISON:  04/22/2014  FINDINGS: Continued volume loss and atelectasis/ collapse of the right upper lobe. Bibasilar opacities again noted with small effusions. Mild cardiomegaly. No acute bony abnormality.  IMPRESSION: No significant change.   Electronically Signed   By: Rolm Baptise M.D.   On: 04/24/2014 08:17     ASSESSMENT / PLAN:  PULMONARY A: Large RUL mass--  worrisome for malignancy -?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 >S/p endobronchial biopsy on 7/3 - path pending  P: Anticoagulation for SVC syndrome > coumadin per pharmacy with heparin bridge  F/u path results -- called pathology - should be back 7/8 Once we know diagnosis will need staging imaging prior to XRT vs chemo Monitor for throat/facial swelling with SVC O2 as needed OOB Incentive spirometry, flutter O2 sats ok on ambulation with RA   CARDIOVASCULAR A:  Tamponade - resolved post pericardial window Hypotension - improved.  Afib with RVR - difficult to control - multiple recurrences of AFib RVR P:  Cardiology following  Amiodarone, B blocker  F/u path from pericardial biopsy Tele  Heparin gtt/ coumadin per pharmacy  May need EP assist - cards considering   RENAL  Recent Labs Lab 04/21/14 0246 04/22/14 0332 04/24/14 0347  NA 130* 129* 132*    A:  Hyponatremia static, likely lung mass related P:  KVO fluids Monitor electrolytes closely and  replete as needed  GASTROINTESTINAL A:  No acute issues P:   Diet as tolerated  HEMATOLOGIC A:  Leukocytosis - likely stress reaction + hydrocortisone.  Improving.  P:  Monitor CBC  INFECTIOUS A:  No acute infectious issues P:   Monitor wbc/temp tr   ENDOCRINE A:  Hyperglycemia - improved  P:   D/C SSI and monitor glucose on chem    NEUROLOGIC A:  Intact  P:   monitor   Nickolas Madrid, NP 04/24/2014  9:47 AM Pager: [phone] or [phone]  *Care during the described time interval was provided by me and/or other providers on the critical care team. I have reviewed this patient's available data,  including medical history, events of note, physical examination and test results as part of my evaluation.  Attending:  I have seen and examined the patient with nurse practitioner/resident and agree with the note above.   Appreciate cardiology Awaiting path  [name], MD [facility] Pager: [phone] Cell: [phone] If no response, call [phone]

## 2014-04-24 NOTE — Progress Notes (Signed)
6 Days Post-Op Procedure(s) (LRB): INTRAOPERATIVE TRANSESOPHAGEAL ECHOCARDIOGRAM (N/A) VIDEO BRONCHOSCOPY SUBXYPHOID PERICARDIAL WINDOW (N/A) Subjective: RUL lung cancer Tamponade- pericardial effusion s/p window Thrombus in RA- INR therapeutic, stop heparin since patient is early postop Path pending-mhe wants Oncology in [facility] Objective: Vital signs in last 24 hours: Temp:  [97.6 F (36.4 C)-98 F (36.7 C)] 98 F (36.7 C) (07/08 0544) Pulse Rate:  [72-74] 72 (07/08 0544) Cardiac Rhythm:  [-] Normal sinus rhythm (07/07 2300) Resp:  [18] 18 (07/08 0544) BP: (98-146)/(58-75) 98/58 mmHg (07/08 0901) SpO2:  [95 %-100 %] 95 % (07/08 0544) Weight:  [175 lb (79.379 kg)] 175 lb (79.379 kg) (07/08 0544)  Hemodynamic parameters for last 24 hours:  nsr  Intake/Output from previous day: 07/07 0701 - 07/08 0700 In: 840 [P.O.:840] Out: 1351 [Urine:1350; Stool:1] Intake/Output this shift:      Lab Results:  Recent Labs  04/23/14 0519 04/24/14 0347  WBC 16.7* 13.2*  HGB 11.6* 11.5*  HCT 34.2* 34.8*  PLT 341 296   BMET:  Recent Labs  04/22/14 0332 04/24/14 0347  NA 129* 132*  K 3.9 3.9  CL 92* 94*  CO2 21 20  GLUCOSE 109* 99  BUN 22 17  CREATININE 0.85 0.71  CALCIUM 8.5 8.6    PT/INR:  Recent Labs  04/24/14 0347  LABPROT 26.2*  INR 2.40*   ABG    Component Value Date/Time   PHART 7.468* 04/20/2014 1310   HCO3 22.0 04/20/2014 1310   TCO2 23.0 04/20/2014 1310   ACIDBASEDEF 1.3 04/20/2014 1310   O2SAT 96.4 04/20/2014 1310   CBG (last 3)   Recent Labs  04/23/14 1605 04/23/14 2128 04/24/14 0600  GLUCAP 126* 96 101*    Assessment/Plan: S/P Procedure(s) (LRB): INTRAOPERATIVE TRANSESOPHAGEAL ECHOCARDIOGRAM (N/A) VIDEO BRONCHOSCOPY SUBXYPHOID PERICARDIAL WINDOW (N/A) Will refer to [facility] when path back F/U wound in office  LOS: 6 days    Dillon Willis,Dillon Willis 04/24/2014

## 2014-04-24 NOTE — Progress Notes (Signed)
Patient: Dillon Willis / Admit Date: 05/10/2014 / Date of Encounter: 04/24/2014, 9:43 AM   Subjective: Feels bad when HR is up - "washed out" and mildly SOB. Manual BP 92/50.   Objective: Telemetry: went back into suspected atrial flutter at 8am Physical Exam: Blood pressure 98/58, pulse 72, temperature 98 F (36.7 C), temperature source Oral, resp. rate 18, height 5\' 10"  (1.778 m), weight 175 lb (79.379 kg), SpO2 95.00%. General: Well developed, well nourished WM in no acute distress.  Head: Normocephalic, atraumatic, sclera non-icteric, no xanthomas, nares are without discharge.  Neck: JVP moderately elevated. Chest: prominent venous collaterals anterior chest, but no edema or plethora of SVC syndrome.  Lungs: Slightly diminished at L lung base. Otherwise no wheezes, rales, or rhonchi. Breathing is unlabored.  Heart: RRR, S1 S2 without murmurs, rubs, or gallops.  Abdomen: Soft, non-tender, non-distended with normoactive bowel sounds. No rebound/guarding.  Extremities: No clubbing or cyanosis. No edema. Distal pedal pulses are 2+ and equal bilaterally.  Neuro: Alert and oriented X 3. Moves all extremities spontaneously.  Psych: Responds to questions appropriately with a normal affect.   Intake/Output Summary (Last 24 hours) at 04/24/14 0943 Last data filed at 04/24/14 0500  Gross per 24 hour  Intake    480 ml  Output    901 ml  Net   -421 ml    Inpatient Medications:  . amiodarone  200 mg Oral BID  . bisacodyl  10 mg Oral Daily  . bisoprolol  2.5 mg Oral Daily  . coumadin book   Does not apply Once  . docusate sodium  100 mg Oral BID  . insulin aspart  0-15 Units Subcutaneous TID WC  . montelukast  10 mg Oral q morning - 10a  . senna-docusate  1 tablet Oral QHS  . warfarin  2.5 mg Oral ONCE-1800  . Warfarin - Pharmacist Dosing Inpatient   Does not apply q1800   Infusions:  . dextrose 5 % and 0.9% NaCl 10 mL/hr at 04/21/14 1000  . heparin 1,900 Units/hr (04/23/14 1621)     Labs:  Recent Labs  04/22/14 0332 04/24/14 0347  NA 129* 132*  K 3.9 3.9  CL 92* 94*  CO2 21 20  GLUCOSE 109* 99  BUN 22 17  CREATININE 0.85 0.71  CALCIUM 8.5 8.6  MG 2.2  --    No results found for this basename: AST, ALT, ALKPHOS, BILITOT, PROT, ALBUMIN,  in the last 72 hours  Recent Labs  04/23/14 0519 04/24/14 0347  WBC 16.7* 13.2*  HGB 11.6* 11.5*  HCT 34.2* 34.8*  MCV 92.4 95.3  PLT 341 296   Radiology/Studies:  Dg Chest 2 View  04/22/2014   CLINICAL DATA:  Lung mass  EXAM: CHEST  2 VIEW  COMPARISON:  04/21/2014  FINDINGS: Small pleural effusions persist. Moderate enlargement of the cardiac silhouette is again noted without evidence for overt edema. Masslike right upper lobe atelectasis with upper retraction of the minor fissure is reidentified.  IMPRESSION: No significant change in masslike opacification and volume loss involving the right upper lobe.  Stable small pleural effusions and cardiomegaly/ enlargement of the cardiac silhouette.   Electronically Signed   By: Conchita Paris M.D.   On: 04/22/2014 18:03   Ct Angio Chest Pe W/cm &/or Wo Cm  04/17/2014   CLINICAL DATA:  Severe shortness of breath especially with exertion, hypotension, vomiting. The patient's creatinine is 1.8 and the GFR is 42.  EXAM: CT ANGIOGRAPHY CHEST WITH CONTRAST  TECHNIQUE: Multidetector CT imaging of the chest was performed using the standard protocol during bolus administration of intravenous contrast. Multiplanar CT image reconstructions and MIPs were obtained to evaluate the vascular anatomy.  CONTRAST:  170mL OMNIPAQUE IOHEXOL 350 MG/ML SOLN intravenously.  COMPARISON:  PA chest x-ray dated April 15, 2014 and noncontrast CT scan of chest dated April 12, 2014.  FINDINGS: Again demonstrated is the large mass in the right paratracheal region extending into the right hilum. This is producing mass effect upon the superior vena cava, and there is thrombus within the superior vena cava with venous  return from the right upper extremity coursing through chest wall collaterals and the azygos system to reach the inferior vena cava. No filling defects are demonstrated within the pulmonary arterial tree. The caliber of the thoracic aorta is normal. The contrast bolus does not allow assessment of the presence of a false lumen. The pericardial effusion has increased and is now moderate in size. The cardiac chambers are normal in size. There are coronary artery calcifications. There are enlarged AP window lymph nodes.  There remains total collapse of the right upper lobe. The right middle lobe exhibits at least 1 abnormal nodule which is been previously demonstrated. The right lower lobe is clear. There is subsegmental atelectasis in the left lower lobe. There are small bilateral pleural effusions which have increased in size since the previous study.  Within the upper abdomen there are splenic calcifications consistent with previous granulomatous infection. There is bilateral adrenal enlargement. The observed portions of the liver are normal. No acute bony abnormalities are demonstrated.  Review of the MIP images confirms the above findings.  IMPRESSION: 1. There is no acute pulmonary embolism. However, there is thrombus within the superior vena cava secondary to mass effect from the large right paratracheal mass. Venous return from the head and upper thorax is via collaterals. This may have existed previously but was not apparent due to the noncontrast nature of the previous study. 2. The pericardial effusion has increased and is now moderate in size with maximal thickness of 2.6 cm. The small bilateral pleural effusions have increased. 3. There remains total right upper lobe collapse. There is subsegmental atelectasis on the left. 4. There is bilateral adrenal enlargement. 5. These results were called by telephone at the time of interpretation on 04/17/2014 at 6:18 PM to Dr. Halford Chessman, who verbally acknowledged these  results.   Electronically Signed   By: David  Martinique   On: 04/17/2014 18:21   Dg Chest Port 1 View  04/24/2014   CLINICAL DATA:  Respiratory distress  EXAM: PORTABLE CHEST - 1 VIEW  COMPARISON:  04/22/2014  FINDINGS: Continued volume loss and atelectasis/ collapse of the right upper lobe. Bibasilar opacities again noted with small effusions. Mild cardiomegaly. No acute bony abnormality.  IMPRESSION: No significant change.   Electronically Signed   By: Rolm Baptise M.D.   On: 04/24/2014 08:17     Assessment and Plan  1. RUL lung mass suspected lung cancer (presented with intermittent hemoptysis), pathology pending from bx 7/2  2. 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 Large pericardial effusion with cardiac tamponade s/p pericardial window 7/3, trivial by relook 7/6, worrisome for mets, path pending 4. Bilateral adrenal enlargement - cortisol level high at 109.9 on 7/3, further per IM  5. Paroxysmal atrial fibrillation/possible flutter  6. Hypotension, stable  7. Hyponatremia, ? r/t lung disease  8. NSVT  9. Small pleural effusions, suspect r/t #1  Continues to go in and out of atrial fibrillation/flutter with NSVT. Was on oral amio->IV amio -> oral amio, now back in what looks like flutter as of 8am. Beta blocker started yesterday, just got this morning's dose a short while ago. Suspect arrhythmia precipitated by acute medical issues and is proving difficult to control. CXR small effusions and bibasilar opacities but afebrile. He may also need further eval of high baseline cortisol level. Atypical cells are seen on bronchial washings and the surgical specimen - formal path not back yet so Katie with PCCM has called - pathologist will be reading today. Per Dr. Sallyanne Kuster given extent of cancer he will not be a surgical candidate. Will give small fluid bolus and change back to IV amiodarone. We may need to consider involving EP for further suggestions.  Signed, Dayna Dunn  PA-C  ADDENDUM: pt went back into NSR. Will still give small fluid bolus but hold off on IV amiodarone. Continue oral amiodarone for now. Dayna Dunn PA-C  I have seen and examined the patient along with Dayna Dunn PA-C.  I have reviewed the chart, notes and new data.  I agree with PA's note.  Key new complaints: felt very poorly during AF, but much better after return to NSR Key examination changes: no signs of CHF Key new findings / data: pathology still pending  PLAN: Symptoms of AF probably related to high ventricular rate. Will get better as amiodarone"builds up". Little room for additional rate control meds due to low BP. Consider adding low dose digoxin if recurrences of AF with RVR are frequent.  Sanda Klein, MD, [place] [phone] 04/24/2014, 12:12 PM

## 2014-04-24 NOTE — Progress Notes (Addendum)
ANTICOAGULATION CONSULT NOTE - Follow Up Consult  Pharmacy Consult for Heparin and Coumadin Indication: clot in SVC and right atrium  No Known Allergies  Patient Measurements: Height: 5\' 10"  (177.8 cm) Weight: 175 lb (79.379 kg) IBW/kg (Calculated) : 73 Heparin Dosing Weight: 79kg  Vital Signs: Temp: 98 F (36.7 C) (07/08 0544) Temp src: Oral (07/08 0544) BP: 146/75 mmHg (07/08 0544) Pulse Rate: 72 (07/08 0544)  Labs:  Recent Labs  04/22/14 0332 04/23/14 0519 04/23/14 0932 04/24/14 0347  HGB 11.4* 11.6*  --  11.5*  HCT 33.9* 34.2*  --  34.8*  PLT 308 341  --  296  LABPROT 15.8* 20.6*  --  26.2*  INR 1.26 1.77*  --  2.40*  HEPARINUNFRC 0.36 0.20* 0.38 0.43  CREATININE 0.85  --   --  0.71    Estimated Creatinine Clearance: 86.2 ml/min (by C-G formula based on Cr of 0.71).   Medications:  Heparin @ 1900 units/hr  Assessment: 72yom continues on heparin and warfarin for clot in the SVC and right atrium 2/2 malignancy. Heparin level is therapeutic this morning.  INR is now therapeutic and continues to increase after 3 days of therapy.  Will backdown on dose slightly again today due to the signficant jump in INR.  Will need to continue to watch for drug interaction with amiodarone.  H/H and plts are stable and no bleeding noted.   Goal of Therapy:  INR 2-3 Monitor platelets by anticoagulation protocol: Yes   Plan:  1) Discontinue heparin 2) Coumadin 2.5mg  x 1 3) Coumadin education today 4) Daily INR  Thank you, Vivia Ewing, PharmD Clinical Pharmacist - Resident Pager: [phone] Pharmacy: [phone] 04/24/2014 8:30 AM

## 2014-04-25 ENCOUNTER — Other Ambulatory Visit: Payer: Self-pay | Admitting: Oncology

## 2014-04-25 ENCOUNTER — Inpatient Hospital Stay (HOSPITAL_COMMUNITY): Payer: Medicare Other

## 2014-04-25 ENCOUNTER — Ambulatory Visit
Admit: 2014-04-25 | Discharge: 2014-04-25 | Disposition: A | Discharge status: Home / Self Care | Payer: Medicare Other | Attending: Radiation Oncology | Admitting: Radiation Oncology

## 2014-04-25 DIAGNOSIS — C3410 Malignant neoplasm of upper lobe, unspecified bronchus or lung: Secondary | ICD-10-CM

## 2014-04-25 DIAGNOSIS — C7931 Secondary malignant neoplasm of brain: Secondary | ICD-10-CM

## 2014-04-25 LAB — PROTIME-INR
INR: 2.96 — ABNORMAL HIGH (ref 0.00–1.49)
Prothrombin Time: 30.8 seconds — ABNORMAL HIGH (ref 11.6–15.2)

## 2014-04-25 LAB — BASIC METABOLIC PANEL
Anion gap: 16 — ABNORMAL HIGH (ref 5–15)
BUN: 21 mg/dL (ref 6–23)
CO2: 23 meq/L (ref 19–32)
Calcium: 9 mg/dL (ref 8.4–10.5)
Chloride: 93 mEq/L — ABNORMAL LOW (ref 96–112)
Creatinine, Ser: 0.87 mg/dL (ref 0.50–1.35)
GFR calc Af Amer: >90 mL/min (ref >90)
GFR calc non Af Amer: 84 mL/min — ABNORMAL LOW (ref >90)
Glucose, Bld: 115 mg/dL — ABNORMAL HIGH (ref 70–99)
POTASSIUM: 4.4 meq/L (ref 3.7–5.3)
SODIUM: 132 meq/L — AB (ref 137–147)

## 2014-04-25 LAB — GLUCOSE, CAPILLARY: Glucose-Capillary: 125 mg/dL — ABNORMAL HIGH (ref 70–99)

## 2014-04-25 LAB — CBC
HEMATOCRIT: 35.8 % — AB (ref 39.0–52.0)
Hemoglobin: 11.9 g/dL — ABNORMAL LOW (ref 13.0–17.0)
MCH: 31.4 pg (ref 26.0–34.0)
MCHC: 33.2 g/dL (ref 30.0–36.0)
MCV: 94.5 fL (ref 78.0–100.0)
Platelets: 307 10*3/uL (ref 150–400)
RBC: 3.79 MIL/uL — AB (ref 4.22–5.81)
RDW: 15.1 % (ref 11.5–15.5)
WBC: 14.9 10*3/uL — AB (ref 4.0–10.5)

## 2014-04-25 MED ORDER — AMIODARONE HCL IN DEXTROSE 360-4.14 MG/200ML-% IV SOLN
30.0000 mg/h | INTRAVENOUS | Status: DC
  Filled 2014-04-25: qty 200

## 2014-04-25 MED ORDER — WARFARIN SODIUM 2.5 MG PO TABS
2.5000 mg | ORAL_TABLET | Freq: Once | ORAL | Status: AC
  Administered 2014-04-25: 2.5 mg via ORAL
  Filled 2014-04-25: qty 1

## 2014-04-25 MED ORDER — AMIODARONE HCL IN DEXTROSE 360-4.14 MG/200ML-% IV SOLN
60.0000 mg/h | INTRAVENOUS | Status: AC
  Administered 2014-04-25: 60 mg/h via INTRAVENOUS
  Filled 2014-04-25: qty 200

## 2014-04-25 MED ORDER — SODIUM CHLORIDE 0.9 % IV BOLUS (SEPSIS)
250.0000 mL | INTRAVENOUS | Status: AC
  Administered 2014-04-25: 250 mL via INTRAVENOUS

## 2014-04-25 MED ORDER — AMIODARONE HCL IN DEXTROSE 360-4.14 MG/200ML-% IV SOLN
30.0000 mg/h | INTRAVENOUS | Status: DC
  Administered 2014-04-25: 30 mg/h via INTRAVENOUS
  Filled 2014-04-25: qty 200

## 2014-04-25 NOTE — Progress Notes (Signed)
ANTICOAGULATION CONSULT NOTE - Follow Up Consult  Pharmacy Consult for Coumadin Indication: clot in SVC and right atrium  No Known Allergies  Patient Measurements: Height: 5\' 10"  (177.8 cm) Weight: 173 lb 1 oz (78.5 kg) IBW/kg (Calculated) : 73 Heparin Dosing Weight: 79kg  Vital Signs: Temp: 97.8 F (36.6 C) (07/09 0446) Temp src: Oral (07/09 0446) BP: 139/76 mmHg (07/09 0446) Pulse Rate: 72 (07/09 0446)  Labs:  Recent Labs  04/23/14 0519 04/23/14 0932 04/24/14 0347 04/25/14 0422  HGB 11.6*  --  11.5* 11.9*  HCT 34.2*  --  34.8* 35.8*  PLT 341  --  296 307  LABPROT 20.6*  --  26.2* 30.8*  INR 1.77*  --  2.40* 2.96*  HEPARINUNFRC 0.20* 0.38 0.43  --   CREATININE  --   --  0.71 0.87    Estimated Creatinine Clearance: 79.2 ml/min (by C-G formula based on Cr of 0.87).   Medications:  Heparin @ 1900 units/hr  Assessment: 72yom continues on warfarin for clot in the SVC and right atrium 2/2 malignancy.  INR remains therapeutic and continues to increase after 4 days of therapy.  The significant increase is likely secondary to the two doses of 7.5mg .  Will need to continue to watch for drug interaction with amiodarone.  H/H and plts are stable and no bleeding noted.   Goal of Therapy:  INR 2-3 Monitor platelets by anticoagulation protocol: Yes   Plan:  1) Coumadin 2.5mg  x 1 2) Coumadin education today 3) Daily INR  Thank you, Vivia Ewing, PharmD Clinical Pharmacist - Resident Pager: [phone] Pharmacy: [phone] 04/25/2014 10:31 AM

## 2014-04-25 NOTE — Progress Notes (Signed)
Discussed with Dr. Jana Hakim who was reviewed pts chart.  He will see pt later today but agrees with initiating tx to [facility] in anticipation of radiation therapy.  Dr. Jana Hakim will call radiation oncology MD to discuss directly.  Will inform cardiology of tx and ask [facility] to assume care 7/10 once pt is at [facility].  Discussed at length with Pt and his son at bedside.    Nickolas Madrid, NP 04/25/2014  1:33 PM Pager: [phone] or [phone]

## 2014-04-25 NOTE — Progress Notes (Signed)
Patient heart rate noted to be increasing in to 150's and also conversion in to atrial fibrillation on heart monitor.  Patient blood pressure 87/57 and complaints of shortness of breath and dizziness.  Cardiology PA Hinton Dyer informed of patients current symptoms.  Telephone order taken for 250cc bolus of normal saline.  Order carried out.  Patients oxygen increased to 4 liters nasal cannula.  Will continue to monitor patients heart rhythm and symptoms.   Dirk Dress 04/25/2014

## 2014-04-25 NOTE — Progress Notes (Signed)
Called by floor pt's HR still 120 -129, discussed with Dr. Burt Knack- we prefer pt to remain here tonight and transfer in AM .

## 2014-04-25 NOTE — Progress Notes (Signed)
7 Days Post-Op Procedure(s) (LRB): INTRAOPERATIVE TRANSESOPHAGEAL ECHOCARDIOGRAM (N/A) VIDEO BRONCHOSCOPY SUBXYPHOID PERICARDIAL WINDOW (N/A) Subjective: Symptomatic from rapid A-fib Surgical incision healing well CXR w/o effusion Objective: Vital signs in last 24 hours: Temp:  [97.4 F (36.3 C)-97.8 F (36.6 C)] 97.8 F (36.6 C) (07/09 0446) Pulse Rate:  [70-73] 72 (07/09 0446) Cardiac Rhythm:  [-] Normal sinus rhythm (07/08 2020) Resp:  [18-20] 20 (07/09 0446) BP: (94-139)/(57-76) 139/76 mmHg (07/09 0446) SpO2:  [95 %-98 %] 96 % (07/09 0446) Weight:  [173 lb 1 oz (78.5 kg)] 173 lb 1 oz (78.5 kg) (07/09 0446)  Hemodynamic parameters for last 24 hours:  afib/ nsr afebrile  Intake/Output from previous day: 07/08 0701 - 07/09 0700 In: 720 [P.O.:720] Out: 1300 [Urine:1300] Intake/Output this shift: Total I/O In: 240 [P.O.:240] Out: -   Incision clean,dry  Lab Results:  Recent Labs  04/24/14 0347 04/25/14 0422  WBC 13.2* 14.9*  HGB 11.5* 11.9*  HCT 34.8* 35.8*  PLT 296 307   BMET:  Recent Labs  04/24/14 0347 04/25/14 0422  NA 132* 132*  K 3.9 4.4  CL 94* 93*  CO2 20 23  GLUCOSE 99 115*  BUN 17 21  CREATININE 0.71 0.87  CALCIUM 8.6 9.0    PT/INR:  Recent Labs  04/25/14 0422  LABPROT 30.8*  INR 2.96*   ABG    Component Value Date/Time   PHART 7.468* 04/20/2014 1310   HCO3 22.0 04/20/2014 1310   TCO2 23.0 04/20/2014 1310   ACIDBASEDEF 1.3 04/20/2014 1310   O2SAT 96.4 04/20/2014 1310   CBG (last 3)   Recent Labs  04/23/14 1605 04/23/14 2128 04/24/14 0600  GLUCAP 126* 96 101*    Assessment/Plan: S/P Procedure(s) (LRB): INTRAOPERATIVE TRANSESOPHAGEAL ECHOCARDIOGRAM (N/A) VIDEO BRONCHOSCOPY SUBXYPHOID PERICARDIAL WINDOW (N/A)  Path of RUL bx shows malig cells with small cell morphology, pericardial fluid cytology -- malig cells- agree with plan to tx to [facility] for oncology eval and treatment   LOS: 7 days    VAN TRIGT III,Rubel Heckard 04/25/2014

## 2014-04-25 NOTE — Progress Notes (Signed)
Patient: Dillon Willis / Admit Date: 05/09/2014 / Date of Encounter: 04/25/2014, 9:15 AM   Subjective: No CP or SOB. As usual, feels better in NSR. Some abdominal bloating.   Objective: Telemetry: yesterday had brief AF in AM, then recurrence in evening - converted and holding NSR since around 7pm last night Physical Exam: Blood pressure 139/76, pulse 72, temperature 97.8 F (36.6 C), temperature source Oral, resp. rate 20, height 5\' 10"  (1.778 m), weight 173 lb 1 oz (78.5 kg), SpO2 96.00%. General: Well developed, well nourished WM in no acute distress.  Head: Normocephalic, atraumatic, sclera non-icteric, no xanthomas, nares are without discharge.  Neck: Supple, not elevated. Lungs: Slightly diminished at bases. Otherwise no wheezes, rales, or rhonchi. Breathing is unlabored.  Heart: RRR, S1 S2 without murmurs, rubs, or gallops.  Abdomen: Soft, non-tender, non-distended with normoactive bowel sounds. No rebound/guarding.  Extremities: No clubbing or cyanosis. No edema. Distal pedal pulses are 2+ and equal bilaterally.  Neuro: Alert and oriented X 3. Moves all extremities spontaneously.  Psych: Responds to questions appropriately with a normal affect.   Intake/Output Summary (Last 24 hours) at 04/25/14 0915 Last data filed at 04/25/14 0451  Gross per 24 hour  Intake    480 ml  Output   1300 ml  Net   -820 ml    Inpatient Medications:  . amiodarone  200 mg Oral BID  . bisacodyl  10 mg Oral Daily  . bisoprolol  2.5 mg Oral Daily  . coumadin book   Does not apply Once  . docusate sodium  100 mg Oral BID  . montelukast  10 mg Oral q morning - 10a  . pantoprazole  40 mg Oral Daily  . senna-docusate  1 tablet Oral QHS  . Warfarin - Pharmacist Dosing Inpatient   Does not apply q1800   Infusions:  . dextrose 5 % and 0.9% NaCl 10 mL/hr at 04/21/14 1000    Labs:  Recent Labs  04/24/14 0347 04/25/14 0422  NA 132* 132*  K 3.9 4.4  CL 94* 93*  CO2 20 23  GLUCOSE 99 115*    BUN 17 21  CREATININE 0.71 0.87  CALCIUM 8.6 9.0   No results found for this basename: AST, ALT, ALKPHOS, BILITOT, PROT, ALBUMIN,  in the last 72 hours  Recent Labs  04/24/14 0347 04/25/14 0422  WBC 13.2* 14.9*  HGB 11.5* 11.9*  HCT 34.8* 35.8*  MCV 95.3 94.5  PLT 296 307   No results found for this basename: CKTOTAL, CKMB, TROPONINI,  in the last 72 hours No components found with this basename: POCBNP,  No results found for this basename: HGBA1C,  in the last 72 hours   Radiology/Studies:  Dg Chest 2 View  04/22/2014   CLINICAL DATA:  Lung mass  EXAM: CHEST  2 VIEW  COMPARISON:  04/21/2014  FINDINGS: Small pleural effusions persist. Moderate enlargement of the cardiac silhouette is again noted without evidence for overt edema. Masslike right upper lobe atelectasis with upper retraction of the minor fissure is reidentified.  IMPRESSION: No significant change in masslike opacification and volume loss involving the right upper lobe.  Stable small pleural effusions and cardiomegaly/ enlargement of the cardiac silhouette.   Electronically Signed   By: Conchita Paris M.D.   On: 04/22/2014 18:03   Ct Head W Wo Contrast  04/24/2014   CLINICAL DATA:  Lung cancer.  EXAM: CT HEAD WITHOUT AND WITH CONTRAST  TECHNIQUE: Contiguous axial images were obtained from the  base of the skull through the vertex without and with intravenous contrast  CONTRAST:  73mL OMNIPAQUE IOHEXOL 300 MG/ML  SOLN  COMPARISON:  None.  FINDINGS: Two enhancing high left frontal lobe lesions measure 9 mm on image 22 and 8 mm on image 23 of series 5. A more inferior anterior right frontal lobe lesion measures 6 mm on image 13. A 3 mm lesion is present on the same image in the anterior left frontal lobe. No additional lesions are evident.  No focal hemorrhage or infarct is present. The ventricles are of normal size. No significant extra-axial fluid collection is present.  A posterior right ethmoid air cell is opacified. The  paranasal sinuses and mastoid air cells are otherwise clear. Atherosclerotic calcifications are present within the cavernous carotid arteries.  IMPRESSION: 1. At least 4 enhancing lesions are evident as described. Two scratch the the largest to or in the high anterior left frontal lobe measuring 8 and 9 mm respectively. 2. Minimal posterior right ethmoid sinus disease.   Electronically Signed   By: Lawrence Santiago M.D.   On: 04/24/2014 18:03   Ct Angio Chest Pe W/cm &/or Wo Cm  04/17/2014   CLINICAL DATA:  Severe shortness of breath especially with exertion, hypotension, vomiting. The patient's creatinine is 1.8 and the GFR is 42.  EXAM: CT ANGIOGRAPHY CHEST WITH CONTRAST  TECHNIQUE: Multidetector CT imaging of the chest was performed using the standard protocol during bolus administration of intravenous contrast. Multiplanar CT image reconstructions and MIPs were obtained to evaluate the vascular anatomy.  CONTRAST:  150mL OMNIPAQUE IOHEXOL 350 MG/ML SOLN intravenously.  COMPARISON:  PA chest x-ray dated April 15, 2014 and noncontrast CT scan of chest dated April 12, 2014.  FINDINGS: Again demonstrated is the large mass in the right paratracheal region extending into the right hilum. This is producing mass effect upon the superior vena cava, and there is thrombus within the superior vena cava with venous return from the right upper extremity coursing through chest wall collaterals and the azygos system to reach the inferior vena cava. No filling defects are demonstrated within the pulmonary arterial tree. The caliber of the thoracic aorta is normal. The contrast bolus does not allow assessment of the presence of a false lumen. The pericardial effusion has increased and is now moderate in size. The cardiac chambers are normal in size. There are coronary artery calcifications. There are enlarged AP window lymph nodes.  There remains total collapse of the right upper lobe. The right middle lobe exhibits at least 1  abnormal nodule which is been previously demonstrated. The right lower lobe is clear. There is subsegmental atelectasis in the left lower lobe. There are small bilateral pleural effusions which have increased in size since the previous study.  Within the upper abdomen there are splenic calcifications consistent with previous granulomatous infection. There is bilateral adrenal enlargement. The observed portions of the liver are normal. No acute bony abnormalities are demonstrated.  Review of the MIP images confirms the above findings.  IMPRESSION: 1. There is no acute pulmonary embolism. However, there is thrombus within the superior vena cava secondary to mass effect from the large right paratracheal mass. Venous return from the head and upper thorax is via collaterals. This may have existed previously but was not apparent due to the noncontrast nature of the previous study. 2. The pericardial effusion has increased and is now moderate in size with maximal thickness of 2.6 cm. The small bilateral pleural effusions have increased. 3.  There remains total right upper lobe collapse. There is subsegmental atelectasis on the left. 4. There is bilateral adrenal enlargement. 5. These results were called by telephone at the time of interpretation on 04/17/2014 at 6:18 PM to Dr. Halford Chessman, who verbally acknowledged these results.   Electronically Signed   By: David  Martinique   On: 04/17/2014 18:21   Dg Chest Port 1 View  04/24/2014   CLINICAL DATA:  Respiratory distress  EXAM: PORTABLE CHEST - 1 VIEW  COMPARISON:  04/22/2014  FINDINGS: Continued volume loss and atelectasis/ collapse of the right upper lobe. Bibasilar opacities again noted with small effusions. Mild cardiomegaly. No acute bony abnormality.  IMPRESSION: No significant change.   Electronically Signed   By: Rolm Baptise M.D.   On: 04/24/2014 08:17    Assessment and Plan  1. RUL lung mass suspected lung cancer (presented with intermittent hemoptysis), pathology  pending from bx 7/2, CT head with at least 4 enhancing lesions 2. 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 Large pericardial effusion with cardiac tamponade s/p pericardial window 7/3, trivial by relook 7/6, worrisome for mets, path pending  4. Bilateral adrenal enlargement - cortisol level high at 109.9 on 7/3, further per IM  5. Paroxysmal atrial fibrillation/flutter, newly recognized this admission, in & out 6. Hypotension, stable  7. Hyponatremia, ? r/t lung disease  8. NSVT  9. Small pleural effusions, suspect r/t #1  Path still pending - atypical cells are seen on bronchial washings - PCCM plans to call again to check. Note findings of CT head with enhancing lesions. Per Dr. Sallyanne Kuster given extent of cancer he will not be a surgical candidate thus does not need any further CV risk assessment. Our hope is that his runs of AF will get better as amiodarone "builds up." Continue this by mouth. Little room for additional rate control meds due to tendency for soft BPs - it is better today, but still in the 90s yesterday. Continue bisoprolol at present dose. Can consider adding low dose digoxin if recurrences become more frequent.  Signed, Melina Copa PA-C  I have seen and examined the patient along with Melina Copa PA-C.  I have reviewed the chart, notes and new data.  I agree with PA's note.  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  PLAN: Continue amiodarone and current dose of bisoprolol. Warfarin anticoagulation may become a big challenge with brain metastases.  Sanda Klein, MD, [place] [phone] 04/25/2014, 11:08 AM

## 2014-04-25 NOTE — Progress Notes (Signed)
PULMONARY / CRITICAL CARE MEDICINE   Name: Dillon Willis MRN: [id number] DOB: December 05, 1940    ADMISSION DATE:  04/21/2014  PRIMARY SERVICE: PCCM  PCP : Dr. Nyra Capes   CHIEF COMPLAINT:  Severe shortness of breath and dizziness   BRIEF PATIENT DESCRIPTION: 57 yowm smoker referred to office 04/17/14 for intermittent hemoptysis  And RUL lung mass complicated by moderate pericardial effusion and hyponatremia and hypotension.    SIGNIFICANT EVENTS / STUDIES:  CTA chest 04/17/14 >neg PE, thrombus within the superior vena cava secondary to mass effect from the large right paratracheal mass. Venous return from the head and upper thorax is via collaterals.  The pericardial effusion has increased and is now moderate in size with maximal thickness of 2.6 cm. The small bilateral pleural effusions have increased.  total right upper lobe collapse. There is subsegmental atelectasis on the left. . There is bilateral adrenal enlargement 2D Echo07/06/2014 >>> tamponade. 7/2 emergent pericardial window and bronchoscopy with RUL mass biopsy by Dr. Nils Pyle 2D Echo post  Window > no more tamponade, RA clot, SVC clot, LVEF 50-55% 7/4 AFib with RVR, ICU transfer held 7/7 on floor, remains on amio and heparin drip. 7/8 CT head >>>1. At least 4 enhancing lesions are evident as described. Two scratch the the largest to or in the high anterior left frontal lobe measuring 8 and 9 mm respectively. 2. Minimal posterior right ethmoid sinus disease. 7/9 surgical path>>> poorly differentiated carcinoma    LINES / TUBES: 7/2 Sub xyphoid pericardial tube >>7/4  CULTURES: 7/2 pericardial fluid >>neg 7/2 pericardial tissue>>>neg  ANTIBIOTICS: 7/2 periop cefuroxime >>off  SUBJECTIVE:  Feeling slightly improved.  C/o mild abd pain/distension.    VITAL SIGNS: Temp:  [97.4 F (36.3 C)-97.8 F (36.6 C)] 97.8 F (36.6 C) (07/09 0446) Pulse Rate:  [70-73] 72 (07/09 0446) Resp:  [18-20] 20 (07/09 0446) BP: (92-139)/(50-76)  139/76 mmHg (07/09 0446) SpO2:  [95 %-98 %] 96 % (07/09 0446) Weight:  [173 lb 1 oz (78.5 kg)] 173 lb 1 oz (78.5 kg) (07/09 0446) HEMODYNAMICS:   VENTILATOR SETTINGS:   INTAKE / OUTPUT: Intake/Output     07/08 0701 - 07/09 0700 07/09 0701 - 07/10 0700   P.O. 720 240   Total Intake(mL/kg) 720 (9.2) 240 (3.1)   Urine (mL/kg/hr) 1300 (0.7)    Stool     Total Output 1300     Net -580 +240          PHYSICAL EXAMINATION:  Gen: NAD OOB in chair  HEENT: NCAT, EOMi, OP clear,  PULM: resps even, non labored on RA, bronchial breath sounds RUL, diminished in bases CV: refular, no mgr, no JVD AB: BS+, soft, nontender, no hsm Ext: warm, no edema, no clubbing, no cyanosis Neuro: A&Ox4, MAEW   LABS:  CBC  Recent Labs Lab 04/23/14 0519 04/24/14 0347 04/25/14 0422  WBC 16.7* 13.2* 14.9*  HGB 11.6* 11.5* 11.9*  HCT 34.2* 34.8* 35.8*  PLT 341 296 307   Coag's  Recent Labs Lab 04/23/14 0519 04/24/14 0347 04/25/14 0422  INR 1.77* 2.40* 2.96*   BMET  Recent Labs Lab 04/22/14 0332 04/24/14 0347 04/25/14 0422  NA 129* 132* 132*  K 3.9 3.9 4.4  CL 92* 94* 93*  CO2 21 20 23   BUN 22 17 21   CREATININE 0.85 0.71 0.87  GLUCOSE 109* 99 115*   Electrolytes  Recent Labs Lab 04/19/2014 1030  04/22/14 0332 04/24/14 0347 04/25/14 0422  CALCIUM 9.9  < > 8.5  8.6 9.0  MG 2.6*  --  2.2  --   --   PHOS 4.7*  --   --   --   --   < > = values in this interval not displayed. Sepsis Markers No results found for this basename: LATICACIDVEN, PROCALCITON, O2SATVEN,  in the last 168 hours ABG  Recent Labs Lab 04/20/14 1310  PHART 7.468*  PCO2ART 30.7*  PO2ART 66.5*   Liver Enzymes  Recent Labs Lab 05/08/2014 1030 04/20/14 0539  AST 19 16  ALT 15 15  ALKPHOS 104 93  BILITOT 0.7 0.7  ALBUMIN 3.4* 2.5*   Cardiac Enzymes  Recent Labs Lab 04/28/2014 1030  PROBNP 1408.0*   Glucose  Recent Labs Lab 04/22/14 2300 04/23/14 0632 04/23/14 1139 04/23/14 1605  04/23/14 2128 04/24/14 0600  GLUCAP 110* 124* 97 126* 96 101*    Imaging Ct Head W Wo Contrast  04/24/2014   CLINICAL DATA:  Lung cancer.  EXAM: CT HEAD WITHOUT AND WITH CONTRAST  TECHNIQUE: Contiguous axial images were obtained from the base of the skull through the vertex without and with intravenous contrast  CONTRAST:  46mL OMNIPAQUE IOHEXOL 300 MG/ML  SOLN  COMPARISON:  None.  FINDINGS: Two enhancing high left frontal lobe lesions measure 9 mm on image 22 and 8 mm on image 23 of series 5. A more inferior anterior right frontal lobe lesion measures 6 mm on image 13. A 3 mm lesion is present on the same image in the anterior left frontal lobe. No additional lesions are evident.  No focal hemorrhage or infarct is present. The ventricles are of normal size. No significant extra-axial fluid collection is present.  A posterior right ethmoid air cell is opacified. The paranasal sinuses and mastoid air cells are otherwise clear. Atherosclerotic calcifications are present within the cavernous carotid arteries.  IMPRESSION: 1. At least 4 enhancing lesions are evident as described. Two scratch the the largest to or in the high anterior left frontal lobe measuring 8 and 9 mm respectively. 2. Minimal posterior right ethmoid sinus disease.   Electronically Signed   By: Lawrence Santiago M.D.   On: 04/24/2014 18:03   Dg Chest Port 1 View  04/24/2014   CLINICAL DATA:  Respiratory distress  EXAM: PORTABLE CHEST - 1 VIEW  COMPARISON:  04/22/2014  FINDINGS: Continued volume loss and atelectasis/ collapse of the right upper lobe. Bibasilar opacities again noted with small effusions. Mild cardiomegaly. No acute bony abnormality.  IMPRESSION: No significant change.   Electronically Signed   By: Rolm Baptise M.D.   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 P: Anticoagulation for SVC syndrome > coumadin per pharmacy with heparin bridge  Oncology to see - suspect will need tx to [facility] and fairly urgent XRT Once we know diagnosis will need staging imaging prior to XRT vs chemo Monitor for throat/facial swelling with SVC O2 as needed OOB Incentive spirometry, flutter O2 sats ok on ambulation with RA   CARDIOVASCULAR A:  Tamponade - resolved post pericardial window Hypotension - improved.  Afib with RVR - difficult to control - multiple recurrences of AFib RVR P:  Cardiology following  Continue PO amiodarone, B blocker  F/u path from pericardial biopsy Tele  coumadin per pharmacy - heparin gtt bridge complete  Consider addition low dose dig if continues to have recurrences of RVR  RENAL A:  Hyponatremia static, likely lung mass related P:  KVO fluids Monitor electrolytes closely and replete as needed  GASTROINTESTINAL A:  abd pain/ distention  P:   Diet as tolerated Port abd xray 7/9  HEMATOLOGIC A:  Leukocytosis - likely stress reaction + hydrocortisone.  Improving.  P:  Monitor CBC  INFECTIOUS A:  No acute infectious issues P:   Monitor wbc/temp tr   ENDOCRINE A:  Hyperglycemia - improved  P:   D/C SSI and monitor glucose on chem    NEUROLOGIC A:  Intact  P:   monitor  Path results with poorly differentiated carcinoma, brain mets on CT head.  Oncology to see asap.  Suspect needs tx to [facility] for XRT.  PT/OT.    Nickolas Madrid, NP 04/25/2014  9:48 AM Pager: [phone] or [phone]  *Care during the described time interval was provided by me and/or other providers on the critical care team. I have reviewed this patient's available data, including medical history, events of note, physical examination and test results as part of my evaluation.  Attending:  I have seen and examined the patient with nurse practitioner/resident and agree with the note  above.   Looks like small cell Given his brain mets, would like to start treating him now  [name], MD [place] [facility] Pager: [phone] Cell: [phone] If no response, call [phone]

## 2014-04-25 NOTE — Progress Notes (Signed)
COURTESY NOTE: 73  Y/o [name] with what appears to be extensive stage small-cell lung cancer, with brain involvement. He was to have been transferred to [facility] today to facilitate care but developed arrhythmias and may be cardioverted this evening. Accordingly transfer has been postponed. I have discussed the case with dr Julien Nordmann our lung cancer specialist and dr Tammi Klippel our brain radiation oncology specialist and they will follow up with Dillon Willis as soon as he is transferred.  Please let me know if I can be of further help.

## 2014-04-25 NOTE — Progress Notes (Signed)
Patient heart rate currently ranging from 120's-130's.  Slight complaint of dizziness.  No shortness of breath currently. Blood pressure improving.  Patient remains in atrial fibrillation.  Will continue to monitor.  Dirk Dress 04/25/2014

## 2014-04-25 NOTE — Progress Notes (Signed)
Pt went back into rapid AF with NSVT again this afternoon (in and out throughout his whole admission). BP on softer side - 73V systolic. He received morning oral amiodarone and bisoprolol doses at 1414 per MAR. Nurse says floor was busy. Have requested a small saline bolus. D/w Dr. Sallyanne Kuster - will give him another 24 hr load of IV amiodarone without bolus and follow. If this is effective, anticipate placing back on increased dose of oral amiodarone tomorrow to 400mg  BID.  Danee Soller PA-C

## 2014-04-26 ENCOUNTER — Encounter: Payer: Self-pay | Admitting: Radiation Oncology

## 2014-04-26 ENCOUNTER — Ambulatory Visit
Admit: 2014-04-26 | Discharge: 2014-04-26 | Disposition: A | Discharge status: Home / Self Care | Payer: Medicare Other | Attending: Radiation Oncology | Admitting: Radiation Oncology

## 2014-04-26 ENCOUNTER — Other Ambulatory Visit: Payer: Self-pay

## 2014-04-26 ENCOUNTER — Other Ambulatory Visit: Payer: Self-pay | Admitting: Internal Medicine

## 2014-04-26 DIAGNOSIS — F17200 Nicotine dependence, unspecified, uncomplicated: Secondary | ICD-10-CM

## 2014-04-26 DIAGNOSIS — C3410 Malignant neoplasm of upper lobe, unspecified bronchus or lung: Secondary | ICD-10-CM

## 2014-04-26 DIAGNOSIS — C801 Malignant (primary) neoplasm, unspecified: Secondary | ICD-10-CM

## 2014-04-26 DIAGNOSIS — C7949 Secondary malignant neoplasm of other parts of nervous system: Secondary | ICD-10-CM

## 2014-04-26 DIAGNOSIS — C7931 Secondary malignant neoplasm of brain: Secondary | ICD-10-CM

## 2014-04-26 LAB — CBC
HCT: 35.5 % — ABNORMAL LOW (ref 39.0–52.0)
Hemoglobin: 11.7 g/dL — ABNORMAL LOW (ref 13.0–17.0)
MCH: 31.2 pg (ref 26.0–34.0)
MCHC: 33 g/dL (ref 30.0–36.0)
MCV: 94.7 fL (ref 78.0–100.0)
PLATELETS: 327 10*3/uL (ref 150–400)
RBC: 3.75 MIL/uL — ABNORMAL LOW (ref 4.22–5.81)
RDW: 15.2 % (ref 11.5–15.5)
WBC: 16.2 10*3/uL — AB (ref 4.0–10.5)

## 2014-04-26 LAB — PROTIME-INR
INR: 3.81 — ABNORMAL HIGH (ref 0.00–1.49)
Prothrombin Time: 37.5 seconds — ABNORMAL HIGH (ref 11.6–15.2)

## 2014-04-26 LAB — GLUCOSE, CAPILLARY: GLUCOSE-CAPILLARY: 141 mg/dL — AB (ref 70–99)

## 2014-04-26 MED ORDER — DIPHENHYDRAMINE HCL 25 MG PO CAPS
25.0000 mg | ORAL_CAPSULE | Freq: Once | ORAL | Status: AC
  Administered 2014-04-26: 25 mg via ORAL
  Filled 2014-04-26: qty 1

## 2014-04-26 MED ORDER — SODIUM CHLORIDE 0.9 % IV BOLUS (SEPSIS)
500.0000 mL | Freq: Once | INTRAVENOUS | Status: AC
  Administered 2014-04-27: 500 mL via INTRAVENOUS

## 2014-04-26 MED ORDER — AMIODARONE HCL 200 MG PO TABS
400.0000 mg | ORAL_TABLET | Freq: Once | ORAL | Status: AC
  Administered 2014-04-26: 400 mg via ORAL
  Filled 2014-04-26: qty 2

## 2014-04-26 MED ORDER — AMIODARONE HCL IN DEXTROSE 360-4.14 MG/200ML-% IV SOLN
30.0000 mg/h | INTRAVENOUS | Status: DC
  Administered 2014-04-26 – 2014-04-27 (×2): 30 mg/h via INTRAVENOUS
  Filled 2014-04-26 (×4): qty 200

## 2014-04-26 MED ORDER — BISOPROLOL FUMARATE 5 MG PO TABS
2.5000 mg | ORAL_TABLET | Freq: Every day | ORAL | Status: DC
  Administered 2014-04-27 – 2014-04-29 (×3): 2.5 mg via ORAL
  Filled 2014-04-26 (×3): qty 1

## 2014-04-26 NOTE — Progress Notes (Signed)
Dillon Willis [phone]   [phone]  CONSULT NOTE  REFERRING PHYSICIAN: Dr. Tharon Aquas Trigt  REASON FOR CONSULTATION:  73 years old white male recently diagnosed with lung cancer  HPI Dillon Willis is a 73 y.o. male with past medical history significant only for hypertension and dyslipidemia as well as long history of smoking. The patient was admitted to the hospital complaining of worsening dyspnea as well as weakness over the last few weeks. CT scan of the chest performed on 04/12/2014 at [facility] showed complete collapse of the right upper lobe. This is most certainly secondary to postobstructive process from at central neoplasm however the neoplasm is difficult to measure on the non contrast exam. The right upper lobe bronchus is completely obstructed. There is a large 5.4 x 5.4 cm right lower paratracheal lymph node which is certainly metastatic. There is a high 2.5 cm right paratracheal lymph node. No clear supraclavicular lymph nodes. There is a 11 mm contralateral left lower paratracheal lymph node. Multiple prevascular lymph nodes are not pathologic by size criteria. There is a small pericardial effusion measuring 7 mm. View of the lung parenchyma demonstrates two nodules within the right middle lobe along the mediastinum measuring 15 and 14 mm. his condition and was getting worse and the patient was transferred to [facility] and CT angiogram of the chest performed on 05/16/2014 showed no acute pulmonary embolism but there was thrombus within the superior vena cava secondary to mass effect from the large right paratracheal mass. No pericardial effusion had increased and is now moderate in size with maximum thickness of 2.6 CM but there was small bilateral pleural effusions that also increased. There remains total right upper lobe collapse and subsegmental atelectasis on the left. There was bilateral adrenal enlargement.  The patient  was started on anticoagulation with IV heparin and he is currently on Coumadin. During his hospitalization at [facility] the patient developed paroxysmal atrial fibrillation with rapid ventricular rate and he was started on treatment with abdomen  On 05/11/2014 the patient underwent video bronchoscopy and biopsy of the right upper lobe mass in addition to a subxiphoid pericardial window for drainage of pericardial effusion. The final cytology (Accession: [id number]) of the pericardial fluid showed malignant cells. The immunohistochemical stain suggested a poorly differentiated carcinoma. The biopsy of the right upper lobe of the lung also showed malignant cells in the immunohistochemical profile was nonspecific and suggest poorly differentiated carcinoma. Based on the discussion with Dr. Lyndon Code at the weekly thoracic conference small cell pathology was entertained. The patient is feeling better today and he was transferred to [facility] for evaluation and consideration of treatment of his condition. CT of the head performed on 04/24/2014 showed at least 4 enhancing lesions, 2 in the high left frontal lobe measuring 0.9 and 0.8 CM and a more inferior anterior right frontal lobe lesion measuring 0.6 CM. There was also 0.3 CM lesion in the anterior left frontal lobe. When seen today he is feeling a little bit better. His son Dillon Willis was at the bedside. He continues to have shortness of breath with exertion. He denied having any significant chest pain. He has no nausea or vomiting. No fever or chills. Family history significant for a brother with throat cancer. The patient is a widow and has 2 sons Dillon Willis and Dillon Willis. He used to work as an Chief Financial Officer and also Administrator. He has a history of smoking cigarettes and cigars for  around 44 years. He quit smoking a few days ago. No history of alcohol or drug abuse. HPI  Past Medical History  Diagnosis Date  . Hypertension   . Hypercholesteremia   .  Seasonal allergies     Past Surgical History  Procedure Laterality Date  . Wrist fracture surgery  2005  . Video bronchoscopy Bilateral 05/11/2014    Procedure: VIDEO BRONCHOSCOPY WITHOUT FLUORO;  Surgeon: Tanda Rockers, MD;  Location: [facility];  Service: Cardiopulmonary;  Laterality: Bilateral;  . Tonsillectomy    . Video bronchoscopy  04/21/2014    Procedure: VIDEO BRONCHOSCOPY;  Surgeon: Ivin Poot, MD;  Location: [place];  Service: Thoracic;;  . Subxyphoid pericardial window N/A 04/17/2014    Procedure: SUBXYPHOID PERICARDIAL WINDOW;  Surgeon: Ivin Poot, MD;  Location: [place];  Service: Thoracic;  Laterality: N/A;  . Intraoperative transesophageal echocardiogram N/A 05/08/2014    Procedure: INTRAOPERATIVE TRANSESOPHAGEAL ECHOCARDIOGRAM;  Surgeon: Ivin Poot, MD;  Location: [place];  Service: Open Heart Surgery;  Laterality: N/A;    Family History  Problem Relation Age of Onset  . Throat cancer Brother   . CAD Father     Social History History  Substance Use Topics  . Smoking status: Current Some Day Smoker -- 1.50 packs/day for 29 years    Types: Cigars, Cigarettes  . Smokeless tobacco: Former Systems developer    Types: Chew     Comment: Stopped smoking cigs in 1982, currently smoking cigars.   . Alcohol Use: Yes     Comment: rarely    No Known Allergies  Current Facility-Administered Medications  Medication Dose Route Frequency Provider Last Rate Last Dose  . alum & mag hydroxide-simeth (MAALOX/MYLANTA) 200-200-20 MG/5ML suspension 30 mL  30 mL Oral Q6H PRN Juanito Doom, MD   30 mL at 04/25/14 0808  . bisacodyl (DULCOLAX) EC tablet 10 mg  10 mg Oral Daily Nani Skillern, PA-C   10 mg at 04/26/14 1111  . bisoprolol (ZEBETA) tablet 2.5 mg  2.5 mg Oral Daily Mihai Croitoru, MD      . coumadin book   Does not apply Once Erik Obey, [facility]      . dextrose 5 %-0.9 % sodium chloride infusion   Intravenous Continuous Ivin Poot, MD 10 mL/hr at 04/21/14 1000    .  docusate sodium (COLACE) capsule 100 mg  100 mg Oral BID Juanito Doom, MD   100 mg at 04/26/14 1104  . fentaNYL (SUBLIMAZE) injection 25-50 mcg  25-50 mcg Intravenous Q2H PRN Nani Skillern, PA-C   25 mcg at 04/20/14 1227  . montelukast (SINGULAIR) tablet 10 mg  10 mg Oral q morning - 10a Donielle Liston Alba, PA-C   10 mg at 04/26/14 1104  . ondansetron (ZOFRAN) injection 4 mg  4 mg Intravenous Q6H PRN Nani Skillern, PA-C      . oxyCODONE (Oxy IR/ROXICODONE) immediate release tablet 5-10 mg  5-10 mg Oral Q4H PRN Donielle M Zimmerman, PA-C      . pantoprazole (PROTONIX) EC tablet 40 mg  40 mg Oral Daily Juanito Doom, MD   40 mg at 04/26/14 1113  . potassium chloride 10 mEq in 50 mL *CENTRAL LINE* IVPB  10 mEq Intravenous Daily PRN Donielle Liston Alba, PA-C      . senna (SENOKOT) tablet 8.6 mg  1 tablet Oral Daily PRN Juanito Doom, MD      . senna-docusate (Senokot-S) tablet 1 tablet  1  tablet Oral QHS Nani Skillern, PA-C   1 tablet at 04/22/14 2128  . traMADol (ULTRAM) tablet 50 mg  50 mg Oral Q6H PRN Nani Skillern, PA-C   50 mg at 04/25/14 0347  . Warfarin - Pharmacist Dosing Inpatient   Does not apply Annetta South, [facility]        Review of Systems  Constitutional: positive for fatigue Eyes: negative Ears, nose, mouth, throat, and face: negative Respiratory: positive for cough and dyspnea on exertion Cardiovascular: negative Gastrointestinal: negative Genitourinary:negative Integument/breast: negative Hematologic/lymphatic: negative Musculoskeletal:negative Neurological: negative Behavioral/Psych: negative Endocrine: negative Allergic/Immunologic: negative  Physical Exam  QQV:ZDGLO, healthy, no distress, well nourished and well developed SKIN: skin color, texture, turgor are normal, no rashes or significant lesions HEAD: Normocephalic, No masses, lesions, tenderness or abnormalities EYES: normal, PERRLA EARS: External ears  normal, Canals clear OROPHARYNX:no exudate, no erythema and lips, buccal mucosa, and tongue normal  NECK: supple, no adenopathy, no JVD LYMPH:  no palpable lymphadenopathy, no hepatosplenomegaly LUNGS: expiratory wheezes bilaterally, scattered rales bilaterally HEART: regular rate & rhythm, no murmurs and no gallops ABDOMEN:abdomen soft, non-tender, normal bowel sounds and no masses or organomegaly BACK: Back symmetric, no curvature., No CVA tenderness EXTREMITIES:no joint deformities, effusion, or inflammation, no edema, no skin discoloration  NEURO: alert & oriented x 3 with fluent speech, no focal motor/sensory deficits  PERFORMANCE STATUS: ECOG 1-2  LABORATORY DATA: Lab Results  Component Value Date   WBC 16.2* 04/26/2014   HGB 11.7* 04/26/2014   HCT 35.5* 04/26/2014   MCV 94.7 04/26/2014   PLT 327 04/26/2014    @LASTCHEM @  RADIOGRAPHIC STUDIES: Dg Chest 2 View  04/22/2014   CLINICAL DATA:  Lung mass  EXAM: CHEST  2 VIEW  COMPARISON:  04/21/2014  FINDINGS: Small pleural effusions persist. Moderate enlargement of the cardiac silhouette is again noted without evidence for overt edema. Masslike right upper lobe atelectasis with upper retraction of the minor fissure is reidentified.  IMPRESSION: No significant change in masslike opacification and volume loss involving the right upper lobe.  Stable small pleural effusions and cardiomegaly/ enlargement of the cardiac silhouette.   Electronically Signed   By: Conchita Paris M.D.   On: 04/22/2014 18:03   Ct Head W Wo Contrast  04/24/2014   CLINICAL DATA:  Lung cancer.  EXAM: CT HEAD WITHOUT AND WITH CONTRAST  TECHNIQUE: Contiguous axial images were obtained from the base of the skull through the vertex without and with intravenous contrast  CONTRAST:  12mL OMNIPAQUE IOHEXOL 300 MG/ML  SOLN  COMPARISON:  None.  FINDINGS: Two enhancing high left frontal lobe lesions measure 9 mm on image 22 and 8 mm on image 23 of series 5. A more inferior anterior  right frontal lobe lesion measures 6 mm on image 13. A 3 mm lesion is present on the same image in the anterior left frontal lobe. No additional lesions are evident.  No focal hemorrhage or infarct is present. The ventricles are of normal size. No significant extra-axial fluid collection is present.  A posterior right ethmoid air cell is opacified. The paranasal sinuses and mastoid air cells are otherwise clear. Atherosclerotic calcifications are present within the cavernous carotid arteries.  IMPRESSION: 1. At least 4 enhancing lesions are evident as described. Two scratch the the largest to or in the high anterior left frontal lobe measuring 8 and 9 mm respectively. 2. Minimal posterior right ethmoid sinus disease.   Electronically Signed   By: Bretta Bang.D.  On: 04/24/2014 18:03   Ct Angio Chest Pe W/cm &/or Wo Cm  04/17/2014   CLINICAL DATA:  Severe shortness of breath especially with exertion, hypotension, vomiting. The patient's creatinine is 1.8 and the GFR is 42.  EXAM: CT ANGIOGRAPHY CHEST WITH CONTRAST  TECHNIQUE: Multidetector CT imaging of the chest was performed using the standard protocol during bolus administration of intravenous contrast. Multiplanar CT image reconstructions and MIPs were obtained to evaluate the vascular anatomy.  CONTRAST:  167mL OMNIPAQUE IOHEXOL 350 MG/ML SOLN intravenously.  COMPARISON:  PA chest x-ray dated April 15, 2014 and noncontrast CT scan of chest dated April 12, 2014.  FINDINGS: Again demonstrated is the large mass in the right paratracheal region extending into the right hilum. This is producing mass effect upon the superior vena cava, and there is thrombus within the superior vena cava with venous return from the right upper extremity coursing through chest wall collaterals and the azygos system to reach the inferior vena cava. No filling defects are demonstrated within the pulmonary arterial tree. The caliber of the thoracic aorta is normal. The contrast bolus  does not allow assessment of the presence of a false lumen. The pericardial effusion has increased and is now moderate in size. The cardiac chambers are normal in size. There are coronary artery calcifications. There are enlarged AP window lymph nodes.  There remains total collapse of the right upper lobe. The right middle lobe exhibits at least 1 abnormal nodule which is been previously demonstrated. The right lower lobe is clear. There is subsegmental atelectasis in the left lower lobe. There are small bilateral pleural effusions which have increased in size since the previous study.  Within the upper abdomen there are splenic calcifications consistent with previous granulomatous infection. There is bilateral adrenal enlargement. The observed portions of the liver are normal. No acute bony abnormalities are demonstrated.  Review of the MIP images confirms the above findings.  IMPRESSION: 1. There is no acute pulmonary embolism. However, there is thrombus within the superior vena cava secondary to mass effect from the large right paratracheal mass. Venous return from the head and upper thorax is via collaterals. This may have existed previously but was not apparent due to the noncontrast nature of the previous study. 2. The pericardial effusion has increased and is now moderate in size with maximal thickness of 2.6 cm. The small bilateral pleural effusions have increased. 3. There remains total right upper lobe collapse. There is subsegmental atelectasis on the left. 4. There is bilateral adrenal enlargement. 5. These results were called by telephone at the time of interpretation on 04/17/2014 at 6:18 PM to Dr. Halford Chessman, who verbally acknowledged these results.   Electronically Signed   By: David  Martinique   On: 04/17/2014 18:21   Dg Chest Port 1 View  04/24/2014   CLINICAL DATA:  Respiratory distress  EXAM: PORTABLE CHEST - 1 VIEW  COMPARISON:  04/22/2014  FINDINGS: Continued volume loss and atelectasis/ collapse of the  right upper lobe. Bibasilar opacities again noted with small effusions. Mild cardiomegaly. No acute bony abnormality.  IMPRESSION: No significant change.   Electronically Signed   By: Rolm Baptise M.D.   On: 04/24/2014 08:17   Dg Chest Port 1 View  04/21/2014   CLINICAL DATA:  Pericardial window procedure  EXAM: PORTABLE CHEST - 1 VIEW  COMPARISON:  04/20/2014  FINDINGS: Cardiac shadow is stable. Persistent right upper lobe collapse secondary to the right peritracheal mass lesion is noted. Bilateral pleural effusions are  seen. Left lower lobe consolidation is seen stable from the prior exam. No new focal abnormality is seen.  IMPRESSION: Stable appearance of the chest from the prior day appear   Electronically Signed   By: Inez Catalina M.D.   On: 04/21/2014 07:14   Dg Chest Port 1 View  04/20/2014   CLINICAL DATA:  Shortness of breath. Pericardial window procedure 2 days ago.  EXAM: PORTABLE CHEST - 1 VIEW 1331 hr  COMPARISON:  Portable chest x-rays earlier same 0453 hr and dating back to 04/17/2014. Two-view chest x-ray 04/15/2014. CT chest 04/12/2014.  FINDINGS: Atelectasis involving the right upper lobe and right upper lobe lung mass as noted on prior examinations, unchanged. Bilateral pleural effusions, left greater than right, and associated dense consolidation in the left lower lobe, unchanged since earlier today in yesterday, but worse than on the examination 2 days ago. No new pulmonary parenchymal abnormalities. Cardiac silhouette upper normal in size to slightly enlarged for technique. Pulmonary vascularity normal without evidence of pulmonary edema.  IMPRESSION: 1. Stable right upper lobe atelectasis and right upper lobe lung mass. 2. Bilateral pleural effusions, left greater than right, with and associated dense passive atelectasis and/or pneumonia in the left lower lobe, stable since yesterday but increased since 2 days ago. 3. No new abnormalities since yesterday.   Electronically Signed   By:  Evangeline Dakin M.D.   On: 04/20/2014 13:56   Dg Chest Port 1 View  04/20/2014   CLINICAL DATA:  Cardiac tamponade  EXAM: PORTABLE CHEST - 1 VIEW  COMPARISON:  Yesterday.  FINDINGS: Stable enlargement of the cardiac silhouette, left basilar airspace opacity, bilateral pleural fluid and right upper lobe collapse. Stable calcified granuloma at the right lung base. Lower thoracic spine degenerative changes.  IMPRESSION: 1. Stable cardiomegaly, left lower lobe atelectasis or pneumonia, bilateral pleural effusions and right upper lobe collapse. 2. No acute abnormality.   Electronically Signed   By: Enrique Sack M.D.   On: 04/20/2014 07:09   Dg Chest Port 1 View  04/19/2014   CLINICAL DATA:  post op pericardial window  EXAM: PORTABLE CHEST - 1 VIEW  COMPARISON:  Portal chest radiograph dated 05/13/2014  FINDINGS: Cardiac silhouette stable. Pericardial drain inferior aspect of the heart stable. Postobstructive right upper collapsed due to a central mediastinal mass stable. Persistent density left retrocardiac region. The aerated portion of the lungs are clear. Stable calcified granuloma right lung base. There is blunting of the left costophrenic angle. Mild degenerative changes in the shoulders.  IMPRESSION: Stable chest radiograph.   Electronically Signed   By: Margaree Mackintosh M.D.   On: 04/19/2014 08:01   Dg Chest Port 1 View  04/20/2014   CLINICAL DATA:  Postop pericardial window. Mediastinal mass and SVC obstruction.  EXAM: PORTABLE CHEST - 1 VIEW  COMPARISON:  Radiographs 04/15/2014.  CT 04/17/2014.  FINDINGS: 1841 hr. A drain is noted over the heart consistent with a pericardial drain. The cardiac silhouette is not significantly decreased in size. There is increased retrocardiac pulmonary opacity. Left pleural effusion appears about the same. There is no pneumothorax. The large mediastinal mass and resulting right upper lobe collapse are unchanged.  IMPRESSION: Interval pericardial drain placement without  demonstrated complication. There is increased left lower lobe airspace disease which may reflect atelectasis or aspiration. The mediastinal mass and right upper lobe collapse have not significantly changed.   Electronically Signed   By: Camie Patience M.D.   On: 04/28/2014 19:01   Dg Chest [facility]  1 View  04/23/2014   CLINICAL DATA:  Preop exam for pericardial window. Respiratory distress.  EXAM: PORTABLE CHEST - 1 VIEW  COMPARISON:  CT, 04/17/2014 and chest radiograph, 04/15/2014  FINDINGS: Right peritracheal/upper lobe mass and right upper lobe atelectasis is without change. There is some opacity at the left lung base that is likely atelectasis. No edema or convincing pneumonia. Cardiopericardial silhouette is mildly enlarged but also unchanged.  Bony thorax is demineralized but grossly intact.  IMPRESSION: Stable appearance from the previous chest radiograph and chest CT. No acute findings in the lungs.   Electronically Signed   By: Lajean Manes M.D.   On: 05/15/2014 16:37   Dg Abd Portable 1v  04/25/2014   CLINICAL DATA:  Abdominal pain and distention  EXAM: PORTABLE ABDOMEN - 1 VIEW  COMPARISON:  None.  FINDINGS: Iodinated contrast fills the bladder. No disproportionate dilatation of bowel. Small calcified densities project over the right renal shadow. Pleural thickening at the right lung base suggesting pleural effusion. No obvious free intraperitoneal gas.  IMPRESSION: Nonobstructive bowel gas pattern.  Possible right nephrolithiasis.   Electronically Signed   By: Maryclare Bean M.D.   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   PLAN: I had a lengthy discussion with the patient and his son today about his current disease stage, prognosis and treatment. I gave the patient the option of palliative care and hospice referral versus consideration of systemic chemotherapy with small cell lung cancer regimen. The patient is interested in proceeding with the chemotherapy and I recommended for him regimen of carboplatin initially with reduced dose of AUC of 4 on day 1 and etoposide 80 mg/M2 on days 1, 2 and 3 followed by Neupogen or Neulasta support.  I discussed with the patient adverse effect of the chemotherapy including but not limited to alopecia, myelosuppression, nausea and vomiting, peripheral neuropathy, liver or renal dysfunction. He would like to proceed with treatment as planned and he gives his verbal consent. He is expected to start the first cycle of this treatment during his hospitalization on 04/27/2014. The patient would also receive today one high dose fraction of radiotherapy to the chest under the care of Dr. Pablo Ledger. He would have a chemotherapy education session before starting the first cycle of his chemotherapy. The patient has 4 small metastatic brain lesions, will continue to monitor this closely and consider him for treatment with whole brain irradiation after completion of the first cycle of his chemotherapy. The patient quit smoking few days ago and I strongly encouraged him to continue with smoke cessation. I will continue to monitor him closely during his hospitalization. I gave the patient and his son the time to ask questions and I answered them completely to their satisfaction. The patient voices understanding of current disease status and treatment options and is in agreement with the current care plan.  All questions were answered. The patient knows to call the clinic with any problems, questions or concerns. We can certainly see the  patient much sooner if necessary.  Thank you so much for allowing me to participate in the care of  Bethann Humble. I will continue to follow up the patient with you and assist in his care.  I spent 55 minutes counseling the patient face to face. The total time spent in the appointment was 80 minutes.  Disclaimer: This note was dictated with voice recognition software. Similar sounding words can inadvertently be transcribed and may not be corrected upon review.   Kester Stimpson K. 04/26/2014, 4:45 PM

## 2014-04-26 NOTE — Progress Notes (Signed)
ANTICOAGULATION CONSULT NOTE - Follow Up Consult  Pharmacy Consult for Coumadin Indication: clot in SVC and right atrium  No Known Allergies  Patient Measurements: Height: 5\' 10"  (177.8 cm) Weight: 174 lb 13.2 oz (79.3 kg) IBW/kg (Calculated) : 73  Vital Signs: Temp: 97.4 F (36.3 C) (07/10 0528) Temp src: Oral (07/10 0528) BP: 94/64 mmHg (07/10 0528) Pulse Rate: 69 (07/10 0528)  Labs:  Recent Labs  04/24/14 0347 04/25/14 0422 04/26/14 0655  HGB 11.5* 11.9* 11.7*  HCT 34.8* 35.8* 35.5*  PLT 296 307 327  LABPROT 26.2* 30.8* 37.5*  INR 2.40* 2.96* 3.81*  HEPARINUNFRC 0.43  --   --   CREATININE 0.71 0.87  --     Estimated Creatinine Clearance: 79.2 ml/min (by C-G formula based on Cr of 0.87).  Assessment: 72yom continues on warfarin for clot in the SVC and right atrium 2/2 malignancy.  INR with significant increase- likely secondary to receiving higher doses of warfarin when started + patient being on amiodarone. INR this morning is SUPRAtherapeutic at 3.81 this morning. No bleeding noted. Hgb low but stable, plts nml.  Goal of Therapy:  INR 2-3 Monitor platelets by anticoagulation protocol: Yes   Plan:  1. Hold warfarin today 2. Daily PT/INR 3. Follow for s/s bleeding  Arely Tinner D. Madesyn Ast, PharmD, BCPS Clinical Pharmacist Pager: [phone] 04/26/2014 10:29 AM

## 2014-04-26 NOTE — Progress Notes (Signed)
PULMONARY / CRITICAL CARE MEDICINE   Name: Dillon Willis MRN: [id number] DOB: 08/02/1941    ADMISSION DATE:  05/12/2014  PRIMARY SERVICE: PCCM  PCP : Dr. Nyra Capes   CHIEF COMPLAINT:  Severe shortness of breath and dizziness   BRIEF PATIENT DESCRIPTION: 65 yowm smoker referred to office 04/17/14 for intermittent hemoptysis  And RUL lung mass complicated by moderate pericardial effusion and hyponatremia and hypotension.    SIGNIFICANT EVENTS / STUDIES:  CTA chest 04/17/14 >neg PE, thrombus within the superior vena cava secondary to mass effect from the large right paratracheal mass. Venous return from the head and upper thorax is via collaterals.  The pericardial effusion has increased and is now moderate in size with maximal thickness of 2.6 cm. The small bilateral pleural effusions have increased.  total right upper lobe collapse. There is subsegmental atelectasis on the left. . There is bilateral adrenal enlargement 2D Echo7/2/15 >>> tamponade. 7/2 emergent pericardial window and bronchoscopy with RUL mass biopsy by Dr. Nils Pyle 2D Echo post  Window > no more tamponade, RA clot, SVC clot, LVEF 50-55% 7/4 AFib with RVR, ICU transfer held 7/7 on floor, remains on amio and heparin drip. 7/8 CT head >>>1. At least 4 enhancing lesions are evident as described. Two scratch the the largest to or in the high anterior left frontal lobe measuring 8 and 9 mm respectively. 2. Minimal posterior right ethmoid sinus disease. 7/9 Afib with RVR again, transfer to [facility] canceled   LINES / TUBES: 7/2 Sub xyphoid pericardial tube >>7/4  CULTURES: 7/2 pericardial fluid >>neg 7/2 pericardial tissue>>>neg  ANTIBIOTICS: 7/2 periop cefuroxime >>off  SUBJECTIVE:  Feels well today, had Afib with RVR and now feels well.   VITAL SIGNS: Temp:  [97.1 F (36.2 C)-98 F (36.7 C)] 97.4 F (36.3 C) (07/10 0528) Pulse Rate:  [68-137] 69 (07/10 0528) Resp:  [20-22] 21 (07/10 0528) BP: (92-106)/(58-71) 94/64  mmHg (07/10 0528) SpO2:  [94 %-99 %] 96 % (07/10 0528) Weight:  [79.3 kg (174 lb 13.2 oz)] 79.3 kg (174 lb 13.2 oz) (07/10 0528) HEMODYNAMICS:   VENTILATOR SETTINGS:   INTAKE / OUTPUT: Intake/Output     07/09 0701 - 07/10 0700 07/10 0701 - 07/11 0700   P.O. 240 120   IV Piggyback 250    Total Intake(mL/kg) 490 (6.2) 120 (1.5)   Urine (mL/kg/hr) 350 (0.2) 200 (0.5)   Total Output 350 200   Net +140 -80          PHYSICAL EXAMINATION:  Gen: Up in bed, comfortable HEENT: NCAT, EOMi, OP clear,  PULM: r bronchial breath sounds RUL, diminished in bases CV: regular, no mgr, no JVD AB: BS+, soft, nontender, no hsm Ext: warm, no edema, no clubbing, no cyanosis Neuro: A&Ox4, MAEW   LABS:  CBC  Recent Labs Lab 04/24/14 0347 04/25/14 0422 04/26/14 0655  WBC 13.2* 14.9* 16.2*  HGB 11.5* 11.9* 11.7*  HCT 34.8* 35.8* 35.5*  PLT 296 307 327   Coag's  Recent Labs Lab 04/24/14 0347 04/25/14 0422 04/26/14 0655  INR 2.40* 2.96* 3.81*   BMET  Recent Labs Lab 04/22/14 0332 04/24/14 0347 04/25/14 0422  NA 129* 132* 132*  K 3.9 3.9 4.4  CL 92* 94* 93*  CO2 21 20 23   BUN 22 17 21   CREATININE 0.85 0.71 0.87  GLUCOSE 109* 99 115*   Electrolytes  Recent Labs Lab 04/22/14 0332 04/24/14 0347 04/25/14 0422  CALCIUM 8.5 8.6 9.0  MG 2.2  --   --  Sepsis Markers No results found for this basename: LATICACIDVEN, PROCALCITON, O2SATVEN,  in the last 168 hours ABG  Recent Labs Lab 04/20/14 1310  PHART 7.468*  PCO2ART 30.7*  PO2ART 66.5*   Liver Enzymes  Recent Labs Lab 04/20/14 0539  AST 16  ALT 15  ALKPHOS 93  BILITOT 0.7  ALBUMIN 2.5*   Cardiac Enzymes No results found for this basename: TROPONINI, PROBNP,  in the last 168 hours Glucose  Recent Labs Lab 04/23/14 0632 04/23/14 1139 04/23/14 1605 04/23/14 2128 04/24/14 0600 04/25/14 1118  GLUCAP 124* 97 126* 96 101* 125*    Imaging Ct Head W Wo Contrast  04/24/2014   CLINICAL DATA:  Lung  cancer.  EXAM: CT HEAD WITHOUT AND WITH CONTRAST  TECHNIQUE: Contiguous axial images were obtained from the base of the skull through the vertex without and with intravenous contrast  CONTRAST:  29mL OMNIPAQUE IOHEXOL 300 MG/ML  SOLN  COMPARISON:  None.  FINDINGS: Two enhancing high left frontal lobe lesions measure 9 mm on image 22 and 8 mm on image 23 of series 5. A more inferior anterior right frontal lobe lesion measures 6 mm on image 13. A 3 mm lesion is present on the same image in the anterior left frontal lobe. No additional lesions are evident.  No focal hemorrhage or infarct is present. The ventricles are of normal size. No significant extra-axial fluid collection is present.  A posterior right ethmoid air cell is opacified. The paranasal sinuses and mastoid air cells are otherwise clear. Atherosclerotic calcifications are present within the cavernous carotid arteries.  IMPRESSION: 1. At least 4 enhancing lesions are evident as described. Two scratch the the largest to or in the high anterior left frontal lobe measuring 8 and 9 mm respectively. 2. Minimal posterior right ethmoid sinus disease.   Electronically Signed   By: Lawrence Santiago M.D.   On: 04/24/2014 18:03   Dg Abd Portable 1v  04/25/2014   CLINICAL DATA:  Abdominal pain and distention  EXAM: PORTABLE ABDOMEN - 1 VIEW  COMPARISON:  None.  FINDINGS: Iodinated contrast fills the bladder. No disproportionate dilatation of bowel. Small calcified densities project over the right renal shadow. Pleural thickening at the right lung base suggesting pleural effusion. No obvious free intraperitoneal gas.  IMPRESSION: Nonobstructive bowel gas pattern.  Possible right nephrolithiasis.   Electronically Signed   By: Maryclare Bean M.D.   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  Afib with RVR - difficult to control - multiple recurrences of AFib RVR P:  Cardiology following  Continue PO amiodarone, B blocker  If and when Afib with RVR returns, cardiology recommends another 24hours of IV amiodarone Tele  coumadin per pharmacy - heparin gtt bridge complete   RENAL A:  Hyponatremia static, likely lung mass related P:  KVO fluids Monitor electrolytes closely and replete as needed  GASTROINTESTINAL A:  abd pain/ distention  P:   Diet as tolerated Port abd xray 7/9  HEMATOLOGIC A:  Leukocytosis -  likely stress reaction + hydrocortisone.  Improving.  P:  Monitor CBC  INFECTIOUS A:  No acute infectious issues P:   Monitor wbc/temp tr   ENDOCRINE A:  Hyperglycemia - improved  P:   monitor glucose on chem    NEUROLOGIC A:  Intact  P:   monitor   Transfer to [facility] now Tel [facility] to pick up, PCCM off  [name], MD [place] P[facility]M Pager: [phone] Cell: [phone] If no response, call [phone]

## 2014-04-26 NOTE — Progress Notes (Addendum)
Radiation Oncology         [phone]) [phone] ________________________________  Initial Inpatient Consultation - Date: 04/25/14   Name: Dillon Willis MRN: [id number]   DOB: [birth year]  REFERRING PHYSICIAN: Gus Magrinat.   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 male  who presented to his PCP with complaints of worsening shortness of breath. He also had episodes of hemoptysis as well as lightheadedness. He also began to notice right upper extremity swelling. Workup included a chest xray which showed a mass in the right upper lobe. He was referred to pulmonary where he was found to be hypoxic and was admitted. A CT of the chest (PE protocol) was performed on 7/1 which showed a large mass in the right paratracheal region extending into the right hilum with complete occlusion of the superior vena cava. Collaterals were noted as well as a large pericardial effusion.  Total collapse of the right upper lobe was noted as well as a nodule in the right middle lobe. Bilateral adrenal nodules were noted as well. A CT of the head on 7/89 showed 4 enhancing lesions consistent with metastases. He underwent bronchoscopy and pericardial window placement on 7/3.  Biopsies obtained at that tie were consistent with poorly differentiated carcinoma.  Post operatively he developed atrial fibrillation with RVR and has been started on amiodarone.  We were consulted for consideration of palliative radiation in the management of his SVC sydrome and brian metastases. He was to be transferred to [facility] for treatment today but his arrythmia prevented this transfer.  He notes that he can lie flat but is uncomfortable from a respiratory standpoint. He denies current hemoptysis or chest pain. His son is at his bedside. He denies headaches.   PREVIOUS RADIATION THERAPY: No  PAST MEDICAL HISTORY:  has a past medical history of Hypertension;  Hypercholesteremia; and Seasonal allergies.    PAST SURGICAL HISTORY: Past Surgical History  Procedure Laterality Date  . Wrist fracture surgery  2005  . Video bronchoscopy Bilateral 05/15/2014    Procedure: VIDEO BRONCHOSCOPY WITHOUT FLUORO;  Surgeon: Tanda Rockers, MD;  Location: [facility];  Service: Cardiopulmonary;  Laterality: Bilateral;  . Tonsillectomy    . Video bronchoscopy  04/28/2014    Procedure: VIDEO BRONCHOSCOPY;  Surgeon: Ivin Poot, MD;  Location: [place];  Service: Thoracic;;  . Subxyphoid pericardial window N/A 05/15/2014    Procedure: SUBXYPHOID PERICARDIAL WINDOW;  Surgeon: Ivin Poot, MD;  Location: [place];  Service: Thoracic;  Laterality: N/A;  . Intraoperative transesophageal echocardiogram N/A 05/07/2014    Procedure: INTRAOPERATIVE TRANSESOPHAGEAL ECHOCARDIOGRAM;  Surgeon: Ivin Poot, MD;  Location: [place];  Service: Open Heart Surgery;  Laterality: N/A;    FAMILY HISTORY:  Family History  Problem Relation Age of Onset  . Throat cancer Brother   . CAD Father     SOCIAL HISTORY:  History  Substance Use Topics  . Smoking status: Current Some Day Smoker -- 1.50 packs/day for 29 years    Types: Cigars, Cigarettes  . Smokeless tobacco: Former Systems developer    Types: Chew     Comment: Stopped smoking cigs in 1982, currently smoking cigars.   . 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   ALLERGIES: Review of patient's allergies indicates no known allergies.  MEDICATIONS:  Current  Facility-Administered Medications  Medication Dose Route Frequency Provider Last Rate Last Dose  . alum & mag hydroxide-simeth (MAALOX/MYLANTA) 200-200-20 MG/5ML suspension 30 mL  30 mL Oral Q6H PRN Juanito Doom, MD   30 mL at 04/25/14 0808  . amiodarone (NEXTERONE PREMIX) 360 MG/200ML (1.8 mg/mL) IV infusion  30 mg/hr Intravenous Continuous Dayna N Dunn, PA-C 16.7 mL/hr at 04/25/14 2149 30 mg/hr at 04/25/14  2149  . bisacodyl (DULCOLAX) EC tablet 10 mg  10 mg Oral Daily Nani Skillern, PA-C   10 mg at 04/23/14 1011  . bisoprolol (ZEBETA) tablet 2.5 mg  2.5 mg Oral Daily Mihai Croitoru, MD   2.5 mg at 04/25/14 1414  . coumadin book   Does not apply Once Erik Obey, [facility]      . dextrose 5 %-0.9 % sodium chloride infusion   Intravenous Continuous Ivin Poot, MD 10 mL/hr at 04/21/14 1000    . docusate sodium (COLACE) capsule 100 mg  100 mg Oral BID Juanito Doom, MD   100 mg at 04/23/14 1011  . fentaNYL (SUBLIMAZE) injection 25-50 mcg  25-50 mcg Intravenous Q2H PRN Nani Skillern, PA-C   25 mcg at 04/20/14 1227  . montelukast (SINGULAIR) tablet 10 mg  10 mg Oral q morning - 10a Donielle Liston Alba, PA-C   10 mg at 04/25/14 1414  . ondansetron (ZOFRAN) injection 4 mg  4 mg Intravenous Q6H PRN Nani Skillern, PA-C      . oxyCODONE (Oxy IR/ROXICODONE) immediate release tablet 5-10 mg  5-10 mg Oral Q4H PRN Donielle Liston Alba, PA-C      . pantoprazole (PROTONIX) EC tablet 40 mg  40 mg Oral Daily Juanito Doom, MD   40 mg at 04/25/14 8413  . potassium chloride 10 mEq in 50 mL *CENTRAL LINE* IVPB  10 mEq Intravenous Daily PRN Donielle Liston Alba, PA-C      . senna (SENOKOT) tablet 8.6 mg  1 tablet Oral Daily PRN Juanito Doom, MD      . senna-docusate (Senokot-S) tablet 1 tablet  1 tablet Oral QHS Nani Skillern, PA-C   1 tablet at 04/22/14 2128  . traMADol (ULTRAM) tablet 50 mg  50 mg Oral Q6H PRN Nani Skillern, PA-C   50 mg at 04/25/14 2440  . Warfarin - Pharmacist Dosing Inpatient   Does not apply [place], [facility]        REVIEW OF SYSTEMS:  A 15 point review of systems is documented in the electronic medical record. This was obtained by the nursing staff. However, I reviewed this with the patient to discuss relevant findings and make appropriate changes.  Pertinent items are noted in HPI.  PHYSICAL EXAM:  Filed Vitals:   04/26/14 0528    BP: 94/64  Pulse: 69  Temp: 97.4 F (36.3 C)  Resp: 21  .174 lb 13.2 oz (79.3 kg). Ill appearing. No distress. Sitting up in bed. Alert and oriented. Collaterals over chest wall. Right upper extremity swelling.   LABORATORY DATA:  Lab Results  Component Value Date   WBC 14.9* 04/25/2014   HGB 11.9* 04/25/2014   HCT 35.8* 04/25/2014   MCV 94.5 04/25/2014   PLT 307 04/25/2014   Lab Results  Component Value Date   NA 132* 04/25/2014   K 4.4 04/25/2014   CL 93* 04/25/2014   CO2 23 04/25/2014   Lab Results  Component Value Date   ALT 15 04/20/2014   AST  16 04/20/2014   ALKPHOS 93 04/20/2014   BILITOT 0.7 04/20/2014     RADIOGRAPHY: Dg Chest 2 View  04/22/2014   CLINICAL DATA:  Lung mass  EXAM: CHEST  2 VIEW  COMPARISON:  04/21/2014  FINDINGS: Small pleural effusions persist. Moderate enlargement of the cardiac silhouette is again noted without evidence for overt edema. Masslike right upper lobe atelectasis with upper retraction of the minor fissure is reidentified.  IMPRESSION: No significant change in masslike opacification and volume loss involving the right upper lobe.  Stable small pleural effusions and cardiomegaly/ enlargement of the cardiac silhouette.   Electronically Signed   By: Conchita Paris M.D.   On: 04/22/2014 18:03   Ct Head W Wo Contrast  04/24/2014   CLINICAL DATA:  Lung cancer.  EXAM: CT HEAD WITHOUT AND WITH CONTRAST  TECHNIQUE: Contiguous axial images were obtained from the base of the skull through the vertex without and with intravenous contrast  CONTRAST:  83mL OMNIPAQUE IOHEXOL 300 MG/ML  SOLN  COMPARISON:  None.  FINDINGS: Two enhancing high left frontal lobe lesions measure 9 mm on image 22 and 8 mm on image 23 of series 5. A more inferior anterior right frontal lobe lesion measures 6 mm on image 13. A 3 mm lesion is present on the same image in the anterior left frontal lobe. No additional lesions are evident.  No focal hemorrhage or infarct is present. The ventricles are of normal  size. No significant extra-axial fluid collection is present.  A posterior right ethmoid air cell is opacified. The paranasal sinuses and mastoid air cells are otherwise clear. Atherosclerotic calcifications are present within the cavernous carotid arteries.  IMPRESSION: 1. At least 4 enhancing lesions are evident as described. Two scratch the the largest to or in the high anterior left frontal lobe measuring 8 and 9 mm respectively. 2. Minimal posterior right ethmoid sinus disease.   Electronically Signed   By: Lawrence Santiago M.D.   On: 04/24/2014 18:03   Ct Angio Chest Pe W/cm &/or Wo Cm  04/17/2014   CLINICAL DATA:  Severe shortness of breath especially with exertion, hypotension, vomiting. The patient's creatinine is 1.8 and the GFR is 42.  EXAM: CT ANGIOGRAPHY CHEST WITH CONTRAST  TECHNIQUE: Multidetector CT imaging of the chest was performed using the standard protocol during bolus administration of intravenous contrast. Multiplanar CT image reconstructions and MIPs were obtained to evaluate the vascular anatomy.  CONTRAST:  148mL OMNIPAQUE IOHEXOL 350 MG/ML SOLN intravenously.  COMPARISON:  PA chest x-ray dated April 15, 2014 and noncontrast CT scan of chest dated April 12, 2014.  FINDINGS: Again demonstrated is the large mass in the right paratracheal region extending into the right hilum. This is producing mass effect upon the superior vena cava, and there is thrombus within the superior vena cava with venous return from the right upper extremity coursing through chest wall collaterals and the azygos system to reach the inferior vena cava. No filling defects are demonstrated within the pulmonary arterial tree. The caliber of the thoracic aorta is normal. The contrast bolus does not allow assessment of the presence of a false lumen. The pericardial effusion has increased and is now moderate in size. The cardiac chambers are normal in size. There are coronary artery calcifications. There are enlarged AP  window lymph nodes.  There remains total collapse of the right upper lobe. The right middle lobe exhibits at least 1 abnormal nodule which is been previously demonstrated. The right lower lobe is  clear. There is subsegmental atelectasis in the left lower lobe. There are small bilateral pleural effusions which have increased in size since the previous study.  Within the upper abdomen there are splenic calcifications consistent with previous granulomatous infection. There is bilateral adrenal enlargement. The observed portions of the liver are normal. No acute bony abnormalities are demonstrated.  Review of the MIP images confirms the above findings.  IMPRESSION: 1. There is no acute pulmonary embolism. However, there is thrombus within the superior vena cava secondary to mass effect from the large right paratracheal mass. Venous return from the head and upper thorax is via collaterals. This may have existed previously but was not apparent due to the noncontrast nature of the previous study. 2. The pericardial effusion has increased and is now moderate in size with maximal thickness of 2.6 cm. The small bilateral pleural effusions have increased. 3. There remains total right upper lobe collapse. There is subsegmental atelectasis on the left. 4. There is bilateral adrenal enlargement. 5. These results were called by telephone at the time of interpretation on 04/17/2014 at 6:18 PM to Dr. Halford Chessman, who verbally acknowledged these results.   Electronically Signed   By: David  Martinique   On: 04/17/2014 18:21   Dg Chest Port 1 View  04/24/2014   CLINICAL DATA:  Respiratory distress  EXAM: PORTABLE CHEST - 1 VIEW  COMPARISON:  04/22/2014  FINDINGS: Continued volume loss and atelectasis/ collapse of the right upper lobe. Bibasilar opacities again noted with small effusions. Mild cardiomegaly. No acute bony abnormality.  IMPRESSION: No significant change.   Electronically Signed   By: Rolm Baptise M.D.   On: 04/24/2014 08:17   Dg  Chest Port 1 View  04/21/2014   CLINICAL DATA:  Pericardial window procedure  EXAM: PORTABLE CHEST - 1 VIEW  COMPARISON:  04/20/2014  FINDINGS: Cardiac shadow is stable. Persistent right upper lobe collapse secondary to the right peritracheal mass lesion is noted. Bilateral pleural effusions are seen. Left lower lobe consolidation is seen stable from the prior exam. No new focal abnormality is seen.  IMPRESSION: Stable appearance of the chest from the prior day appear   Electronically Signed   By: Inez Catalina M.D.   On: 04/21/2014 07:14   Dg Chest Port 1 View  04/20/2014   CLINICAL DATA:  Shortness of breath. Pericardial window procedure 2 days ago.  EXAM: PORTABLE CHEST - 1 VIEW 1331 hr  COMPARISON:  Portable chest x-rays earlier same 0453 hr and dating back to 05/12/2014. Two-view chest x-ray 04/15/2014. CT chest 04/12/2014.  FINDINGS: Atelectasis involving the right upper lobe and right upper lobe lung mass as noted on prior examinations, unchanged. Bilateral pleural effusions, left greater than right, and associated dense consolidation in the left lower lobe, unchanged since earlier today in yesterday, but worse than on the examination 2 days ago. No new pulmonary parenchymal abnormalities. Cardiac silhouette upper normal in size to slightly enlarged for technique. Pulmonary vascularity normal without evidence of pulmonary edema.  IMPRESSION: 1. Stable right upper lobe atelectasis and right upper lobe lung mass. 2. Bilateral pleural effusions, left greater than right, with and associated dense passive atelectasis and/or pneumonia in the left lower lobe, stable since yesterday but increased since 2 days ago. 3. No new abnormalities since yesterday.   Electronically Signed   By: Evangeline Dakin M.D.   On: 04/20/2014 13:56   Dg Chest Port 1 View  04/20/2014   CLINICAL DATA:  Cardiac tamponade  EXAM: PORTABLE CHEST -  1 VIEW  COMPARISON:  Yesterday.  FINDINGS: Stable enlargement of the cardiac silhouette, left  basilar airspace opacity, bilateral pleural fluid and right upper lobe collapse. Stable calcified granuloma at the right lung base. Lower thoracic spine degenerative changes.  IMPRESSION: 1. Stable cardiomegaly, left lower lobe atelectasis or pneumonia, bilateral pleural effusions and right upper lobe collapse. 2. No acute abnormality.   Electronically Signed   By: Enrique Sack M.D.   On: 04/20/2014 07:09   Dg Chest Port 1 View  04/19/2014   CLINICAL DATA:  post op pericardial window  EXAM: PORTABLE CHEST - 1 VIEW  COMPARISON:  Portal chest radiograph dated 05/02/2014  FINDINGS: Cardiac silhouette stable. Pericardial drain inferior aspect of the heart stable. Postobstructive right upper collapsed due to a central mediastinal mass stable. Persistent density left retrocardiac region. The aerated portion of the lungs are clear. Stable calcified granuloma right lung base. There is blunting of the left costophrenic angle. Mild degenerative changes in the shoulders.  IMPRESSION: Stable chest radiograph.   Electronically Signed   By: Margaree Mackintosh M.D.   On: 04/19/2014 08:01   Dg Chest Port 1 View  04/17/2014   CLINICAL DATA:  Postop pericardial window. Mediastinal mass and SVC obstruction.  EXAM: PORTABLE CHEST - 1 VIEW  COMPARISON:  Radiographs 04/15/2014.  CT 04/17/2014.  FINDINGS: 1841 hr. A drain is noted over the heart consistent with a pericardial drain. The cardiac silhouette is not significantly decreased in size. There is increased retrocardiac pulmonary opacity. Left pleural effusion appears about the same. There is no pneumothorax. The large mediastinal mass and resulting right upper lobe collapse are unchanged.  IMPRESSION: Interval pericardial drain placement without demonstrated complication. There is increased left lower lobe airspace disease which may reflect atelectasis or aspiration. The mediastinal mass and right upper lobe collapse have not significantly changed.   Electronically Signed   By: Camie Patience M.D.   On: 04/19/2014 19:01   Dg Chest Port 1 View  04/26/2014   CLINICAL DATA:  Preop exam for pericardial window. Respiratory distress.  EXAM: PORTABLE CHEST - 1 VIEW  COMPARISON:  CT, 04/17/2014 and chest radiograph, 04/15/2014  FINDINGS: Right peritracheal/upper lobe mass and right upper lobe atelectasis is without change. There is some opacity at the left lung base that is likely atelectasis. No edema or convincing pneumonia. Cardiopericardial silhouette is mildly enlarged but also unchanged.  Bony thorax is demineralized but grossly intact.  IMPRESSION: Stable appearance from the previous chest radiograph and chest CT. No acute findings in the lungs.   Electronically Signed   By: Lajean Manes M.D.   On: 05/10/2014 16:37   Dg Abd Portable 1v  04/25/2014   CLINICAL DATA:  Abdominal pain and distention  EXAM: PORTABLE ABDOMEN - 1 VIEW  COMPARISON:  None.  FINDINGS: Iodinated contrast fills the bladder. No disproportionate dilatation of bowel. Small calcified densities project over the right renal shadow. Pleural thickening at the right lung base suggesting pleural effusion. No obvious free intraperitoneal gas.  IMPRESSION: Nonobstructive bowel gas pattern.  Possible right nephrolithiasis.   Electronically Signed   By: Maryclare Bean M.D.   On: 04/25/2014 10:27     IMPRESSION: Metastatic carcinoma to brain with SVC syndrome  PLAN:I spoke to the patient and son at length this evening. We discussed his prognosis and goals of care. We discussed palliative radiation to help with his SVC syndrome and perhaps aid in his cardiac issues. We discussed probably treatment to his brain as well  as the necessity of chemotherapy.  At this point, I think we should proceed with single fraction treatment to the chest as he has difficulty lying down flat for long periods of time and is asymptomatic from a brain perspective. After a single fraction, we can see how he responds and either initiate chemotherapy or begin 2  weeks of whole brain radiotherapy.  These therapies are palliative in nature but I believe will provide him with a good quality of life.  He will obviously need to be stable from a cardiac standpoint and as soon as he is transferred will be simulated and treated.  I spent 60 minutes  face to face with the patient and more than 50% of that time was spent in counseling and/or coordination of care.   ------------------------------------------------  Thea Silversmith, MD

## 2014-04-26 NOTE — Progress Notes (Signed)
[facility] Radiation Oncology Simulation and Treatment Planning Note   Name: ASHE GAGO MRN: [id number]  Date: 04/26/2014  DOB: 12/15/1940  Status: Inpatient    DIAGNOSIS: Lung cancer with SVC syndrome    SIDE: right   CONSENT VERIFIED: yes   SET UP AND IMMOBILIZATION: Patient is setup supine on a breast board with a vac loc for immobilization.   NARRATIVE: The patient was brought to the [place].  Identity was confirmed.  All relevant records and images related to the planned course of therapy were reviewed.  Then, the patient was positioned in a stable reproducible clinical set-up for radiation therapy.  CT images were obtained.  Skin markings were placed.  The CT images were loaded into the planning software where the target and avoidance structures were contoured.  The radiation prescription was entered and confirmed.   TREATMENT PLANNING NOTE:  Treatment planning then occurred. An isodose plan is requested.   3 complex treatment devices were created in the form of unique MLCs which will be used to protect his normal lung and the vacloc which will be used for immobilization.

## 2014-04-26 NOTE — Progress Notes (Signed)
PT Cancellation Note  Patient Details Name: Dillon Willis MRN: [id number] DOB: 1940-11-28   Cancelled Treatment:    Reason Eval/Treat Not Completed: Other (comment) (Attempted treatment in am and pt declined.  Pm, pt transferred to [facility]).  Will attempt evaluation tomorrow.  Thanks.   INGOLD,Bettina Warn 04/26/2014, 3:07 PM  [facility] Acute Rehabilitation [phone] (pager)

## 2014-04-26 NOTE — Progress Notes (Signed)
Around 2130, patient went back into A.fib with RVR. Telemetry made RN aware. Patient stated he could feel his heart beating fast. Patient still complaining of chest tightness and SOB, but said it was how he usually felt. RN did an EKG and it said A. Flutter. Hospitalist was notified and NP stated to call cardiology. Cardiology was paged and while waiting for the call, RN called rapid response. BP was in the 90's/60's and HR sustaining in the 140's. Cardiologist called and gave new orders to start the IV amiodarone drip and transfer to ICU. Patient was willed to ICU, hospitalist was notified of the transfer to ICU, and then RN gave report to the RN taking care of him in ICU.

## 2014-04-26 NOTE — Progress Notes (Signed)
Patient Name: Dillon Willis Date of Encounter: 04/26/2014  Active Problems:   Lung mass   Pericardial effusion with cardiac tamponade   SVC syndrome   Atrial fibrillation   Length of Stay: 8  SUBJECTIVE  Feels well - atrial fibrillation resolved last evening  CURRENT MEDS . bisacodyl  10 mg Oral Daily  . bisoprolol  2.5 mg Oral Daily  . coumadin book   Does not apply Once  . docusate sodium  100 mg Oral BID  . montelukast  10 mg Oral q morning - 10a  . pantoprazole  40 mg Oral Daily  . senna-docusate  1 tablet Oral QHS  . Warfarin - Pharmacist Dosing Inpatient   Does not apply q1800    OBJECTIVE   Intake/Output Summary (Last 24 hours) at 04/26/14 0925 Last data filed at 04/25/14 2144  Gross per 24 hour  Intake    250 ml  Output    350 ml  Net   -100 ml   Filed Weights   04/24/14 0544 04/25/14 0446 04/26/14 0528  Weight: 175 lb (79.379 kg) 173 lb 1 oz (78.5 kg) 174 lb 13.2 oz (79.3 kg)    PHYSICAL EXAM Filed Vitals:   04/25/14 1705 04/25/14 1807 04/25/14 2141 04/26/14 0528  BP: 103/64 95/59 98/67  94/64  Pulse: 102 124 70 69  Temp:   97.1 F (36.2 C) 97.4 F (36.3 C)  TempSrc:   Oral Oral  Resp:   22 21  Height:      Weight:    174 lb 13.2 oz (79.3 kg)  SpO2:   97% 96%   General: Alert, oriented x3, no distress Head: no evidence of trauma, PERRL, EOMI, no exophtalmos or lid lag, no myxedema, no xanthelasma; normal ears, nose and oropharynx Neck: normal jugular venous pulsations and no hepatojugular reflux; brisk carotid pulses without delay and no carotid bruits Chest: prominent veno-venous collateral circulation, Cardiovascular: normal position and quality of the apical impulse, regular rhythm, normal first and second heart sounds, no rubs or gallops, no murmur Abdomen: no tenderness or distention, no masses by palpation, no abnormal pulsatility or arterial bruits, normal bowel sounds, no hepatosplenomegaly Extremities: no clubbing, cyanosis or edema; 2+  radial, ulnar and brachial pulses bilaterally; 2+ right femoral, posterior tibial and dorsalis pedis pulses; 2+ left femoral, posterior tibial and dorsalis pedis pulses; no subclavian or femoral bruits Neurological: grossly nonfocal  LABS  CBC  Recent Labs  04/25/14 0422 04/26/14 0655  WBC 14.9* 16.2*  HGB 11.9* 11.7*  HCT 35.8* 35.5*  MCV 94.5 94.7  PLT 307 397   Basic Metabolic Panel  Recent Labs  04/24/14 0347 04/25/14 0422  NA 132* 132*  K 3.9 4.4  CL 94* 93*  CO2 20 23  GLUCOSE 99 115*  BUN 17 21  CREATININE 0.71 0.87  CALCIUM 8.6 9.0   Radiology Studies Imaging results have been reviewed  TELE NSR since 2000h last night   ASSESSMENT AND PLAN Widely metastatic small cell Ca of the lung with SVC syndrome, pericardial involvement and cerebral mets. Recurrent PAF, gradually less frequent as amiodarone "builds up". I think he is ready for transfer to [facility] to begin palliative chemoradiation. Amiodarone 400 mg PO BID for 2 weeks, then 400 mg daily for 2 weeks, then 200 mg daily. Future episodes of breakthrough AF with RVR are possible (even likely) and can be treated with another 24h "round" of IV amiodarone.   Sanda Klein, MD, [facility] [phone] office [phone] pager  04/26/2014 9:25 AM

## 2014-04-26 NOTE — Progress Notes (Signed)
       [address]       ,[place] [address]             [phone]          8 Days Post-Op Procedure(s) (LRB): INTRAOPERATIVE TRANSESOPHAGEAL ECHOCARDIOGRAM (N/A) VIDEO BRONCHOSCOPY SUBXYPHOID PERICARDIAL WINDOW (N/A)  Subjective: Comfortable, breathing stable.  Back in SR this am.   Objective: Vital signs in last 24 hours: Patient Vitals for the past 24 hrs:  BP Temp Temp src Pulse Resp SpO2 Weight  04/26/14 0528 94/64 mmHg 97.4 F (36.3 C) Oral 69 21 96 % 174 lb 13.2 oz (79.3 kg)  04/25/14 2141 98/67 mmHg 97.1 F (36.2 C) Oral 70 22 97 % -  04/25/14 1807 95/59 mmHg - - 124 - - -  04/25/14 1705 103/64 mmHg - - 102 - - -  04/25/14 1644 106/58 mmHg - - 137 - - -  04/25/14 1606 95/71 mmHg - - 130 20 98 % -  04/25/14 1548 92/60 mmHg - - 120 22 99 % -  04/25/14 1508 - - - 68 - - -  04/25/14 1504 - - - 104 - - -  04/25/14 1255 101/62 mmHg 98 F (36.7 C) Oral 70 20 94 % -   Current Weight  04/26/14 174 lb 13.2 oz (79.3 kg)     Intake/Output from previous day: 07/09 0701 - 07/10 0700 In: 490 [P.O.:240; IV Piggyback:250] Out: 350 [Urine:350]    PHYSICAL EXAM:  Heart: RRR Lungs: Clear Wound:Clean and dry   Lab Results: CBC: Recent Labs  04/25/14 0422 04/26/14 0655  WBC 14.9* 16.2*  HGB 11.9* 11.7*  HCT 35.8* 35.5*  PLT 307 327   BMET:  Recent Labs  04/24/14 0347 04/25/14 0422  NA 132* 132*  K 3.9 4.4  CL 94* 93*  CO2 20 23  GLUCOSE 99 115*  BUN 17 21  CREATININE 0.71 0.87  CALCIUM 8.6 9.0    PT/INR:  Recent Labs  04/26/14 0655  LABPROT 37.5*  INR 3.81*      Assessment/Plan: S/P Procedure(s) (LRB): INTRAOPERATIVE TRANSESOPHAGEAL ECHOCARDIOGRAM (N/A) VIDEO BRONCHOSCOPY SUBXYPHOID PERICARDIAL WINDOW (N/A) Stable from surgical standpoint. For tx to [facility] for radiation therapy when cardiac status stable. RA thrombus- INR trending up- would watch closely in light of his recent pericardial effusion and pericardial  window surgery. Chest tube suture may be removed in 4-5 days.   LOS: 8 days    Dillon Willis 04/26/2014

## 2014-04-26 NOTE — Progress Notes (Signed)
Cardiology [facility] Called regarding patient converting back to RVR, rates sustained in the 140s. Reviewed of EKG shows coarse afib. BPs soft. 90s to 100s. Recommend restarting IV amiodarone 0.5 mg/min for rate control (may require transfer to higher level of care at [facility]).

## 2014-04-26 NOTE — Progress Notes (Signed)
At 1344, received telephone report from Angelica, RN on 2W at [facility].

## 2014-04-27 DIAGNOSIS — I4892 Unspecified atrial flutter: Secondary | ICD-10-CM

## 2014-04-27 DIAGNOSIS — R52 Pain, unspecified: Secondary | ICD-10-CM

## 2014-04-27 LAB — BASIC METABOLIC PANEL
Anion gap: 19 — ABNORMAL HIGH (ref 5–15)
BUN: 34 mg/dL — AB (ref 6–23)
CHLORIDE: 94 meq/L — AB (ref 96–112)
CO2: 21 mEq/L (ref 19–32)
Calcium: 9.1 mg/dL (ref 8.4–10.5)
Creatinine, Ser: 0.91 mg/dL (ref 0.50–1.35)
GFR calc Af Amer: >90 mL/min (ref >90)
GFR calc non Af Amer: 83 mL/min — ABNORMAL LOW (ref >90)
GLUCOSE: 138 mg/dL — AB (ref 70–99)
POTASSIUM: 4.4 meq/L (ref 3.7–5.3)
Sodium: 134 mEq/L — ABNORMAL LOW (ref 137–147)

## 2014-04-27 LAB — MAGNESIUM: Magnesium: 2.5 mg/dL (ref 1.5–2.5)

## 2014-04-27 LAB — CBC
HEMATOCRIT: 36.2 % — AB (ref 39.0–52.0)
Hemoglobin: 11.8 g/dL — ABNORMAL LOW (ref 13.0–17.0)
MCH: 30.9 pg (ref 26.0–34.0)
MCHC: 32.6 g/dL (ref 30.0–36.0)
MCV: 94.8 fL (ref 78.0–100.0)
Platelets: 324 10*3/uL (ref 150–400)
RBC: 3.82 MIL/uL — ABNORMAL LOW (ref 4.22–5.81)
RDW: 15 % (ref 11.5–15.5)
WBC: 17.1 10*3/uL — ABNORMAL HIGH (ref 4.0–10.5)

## 2014-04-27 LAB — PROTIME-INR
INR: 4.76 — ABNORMAL HIGH (ref 0.00–1.49)
Prothrombin Time: 44.7 seconds — ABNORMAL HIGH (ref 11.6–15.2)

## 2014-04-27 MED ORDER — BIOTENE DRY MOUTH MT LIQD
15.0000 mL | Freq: Two times a day (BID) | OROMUCOSAL | Status: DC
  Administered 2014-04-27 – 2014-04-29 (×3): 15 mL via OROMUCOSAL

## 2014-04-27 MED ORDER — DIGOXIN 0.25 MG/ML IJ SOLN
0.2500 mg | Freq: Four times a day (QID) | INTRAMUSCULAR | Status: AC
  Administered 2014-04-27 (×3): 0.25 mg via INTRAVENOUS
  Filled 2014-04-27 (×5): qty 1

## 2014-04-27 MED ORDER — DIPHENHYDRAMINE HCL 25 MG PO CAPS
ORAL_CAPSULE | ORAL | Status: AC
  Filled 2014-04-27: qty 1

## 2014-04-27 MED ORDER — DIPHENHYDRAMINE HCL 25 MG PO CAPS
25.0000 mg | ORAL_CAPSULE | Freq: Once | ORAL | Status: AC
  Administered 2014-04-27: 25 mg via ORAL

## 2014-04-27 MED ORDER — SODIUM CHLORIDE 0.9 % IV BOLUS (SEPSIS)
500.0000 mL | Freq: Once | INTRAVENOUS | Status: AC
  Administered 2014-04-27: 500 mL via INTRAVENOUS

## 2014-04-27 NOTE — Progress Notes (Signed)
At about 2300 on 04/26/14, Rapid Response called to room 1433 for P, Pt's RN reported Pt having low blood pressure and complaining of SOB with chest pressure, EKG revealed A-flutter. Pt evaluated at bedside and placed on cardiac monitor, assessment revealed BP was 84/62 (67), HR 135, RR 29, oxygen saturation 96% on 4L [place]. Pt was alert and oriented x 3, reported intermittent pressure in chest, saturation dropped to 88% with any episode of the pressure in the chest. Order received from Cardiologist on call to transfer pt to the ICU/SD unit and start Pt on Amiodarone drip. Pt transported via bed to room 1225.

## 2014-04-27 NOTE — Progress Notes (Signed)
ANTICOAGULATION CONSULT NOTE - Follow Up Consult  Pharmacy Consult for Coumadin Indication: clot in SVC and right atrium  No Known Allergies  Patient Measurements: Height: 5\' 10"  (177.8 cm) Weight: 179 lb 14.3 oz (81.6 kg) IBW/kg (Calculated) : 73  Vital Signs: Temp: 97.8 F (36.6 C) (07/11 1100) Temp src: Oral (07/11 1100) BP: 116/73 mmHg (07/11 1100) Pulse Rate: 75 (07/11 1100)  Labs:  Recent Labs  04/25/14 0422 04/26/14 0655 04/27/14 0025  HGB 11.9* 11.7* 11.8*  HCT 35.8* 35.5* 36.2*  PLT 307 327 324  LABPROT 30.8* 37.5* 44.7*  INR 2.96* 3.81* 4.76*  CREATININE 0.87  --  0.91   Estimated Creatinine Clearance: 75.8 ml/min (by C-G formula based on Cr of 0.91).  Assessment: Dillon Willis continues on warfarin for clot in the SVC and right atrium 2/2 malignancy.  INR with significant increase- likely secondary to receiving higher doses of warfarin when started + patient being on amiodarone. Heparin infusion 7/3-7/8 with Warfarin begun 7/5;has received 7.5,7.5,5,2.5,2.5mg  Warfarin.  INR supratherapeutic 7/10 - Warfarin held, patient transferred back to [facility] from [facility]  INR continues to rise - 4.76 this am, likely due to Amiodarone  CBC unchanged  Goal of Therapy:  INR 2-3   Plan:   Continue to hold Warfarin  Daily PT/INR  Monitor CBC, signs of bleed

## 2014-04-27 NOTE — Progress Notes (Signed)
Subjective:  Went back into atrial fibrillation with rapid response last night and was placed on intravenous amiodarone again. He complains of weakness and mild shortness of breath today. No definite chest pain.  Objective:  Vital Signs in the last 24 hours: BP 89/73  Pulse 100  Temp(Src) 97.5 F (36.4 C) (Oral)  Resp 27  Ht 5\' 10"  (1.778 m)  Wt 81.6 kg (179 lb 14.3 oz)  BMI 25.81 kg/m2  SpO2 95%  Physical Exam: Pleasant white male sitting up in bed complaining of mild shortness of breath Lungs:  Clear  Cardiac: Rapid irregular rhythm, normal S1 and S2, no S3 Extremities:  No edema present  Intake/Output from previous day: 07/10 0701 - 07/11 0700 In: 240 [P.O.:240] Out: 450 [Urine:450] Weight Filed Weights   04/26/14 1500 04/27/14 0000 04/27/14 0500  Weight: 79.379 kg (175 lb) 80.5 kg (177 lb 7.5 oz) 81.6 kg (179 lb 14.3 oz)    Lab Results: Basic Metabolic Panel:  Recent Labs  04/25/14 0422 04/27/14 0025  NA 132* 134*  K 4.4 4.4  CL 93* 94*  CO2 23 21  GLUCOSE 115* 138*  BUN 21 34*  CREATININE 0.87 0.91    CBC:  Recent Labs  04/26/14 0655 04/27/14 0025  WBC 16.2* 17.1*  HGB 11.7* 11.8*  HCT 35.5* 36.2*  MCV 94.7 94.8  PLT 327 324    BNP    Component Value Date/Time   PROBNP 1408.0* 05/04/2014 1030    Telemetry: Atrial fibrillation with rapid ventricular response, he just converted back to sinus rhythm.  Assessment/Plan:  1. Recurrent atrial fibrillation with rapid ventricular response on amiodarone 2. Intermittent hypotension 3. 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 Prior pericardial tamponade Recommendations:  His blood pressure is somewhat soft which will limit the use of  rate control agents because of hypotension. He is back on intravenous amiodarone. I would go ahead and place him on some digoxin as he has a tendency to go fast while the amiodarone is going.     Kerry Hough  MD  [facility]  04/27/2014, 8:04 AM

## 2014-04-27 NOTE — Progress Notes (Signed)
PT Cancellation Note  Patient Details Name: Dillon Willis MRN: [id number] DOB: April 19, 1941   Cancelled Treatment:    Reason Eval/Treat Not Completed: Fatigue/lethargy limiting ability to participate Pt with afib with RVR last night and currently on amiodarone drip.  Pt prefers to to hold therapy today due to elevated HR with activity and increased SOB and agreeable for PT to check back tomorrow.    Dymir Neeson,KATHrine E 04/27/2014, 11:13 AM Carmelia Bake, PT, DPT 04/27/2014 Pager: [phone]

## 2014-04-27 NOTE — Progress Notes (Signed)
Subjective: The patient is seen and examined today. His son was at the bedside. He has few events last night that requires a rapid response team, including atrial flutter and a short period of V. Tach. He was seen by cardiology and was started again on amiodarone drip. He is very tired and fatigued this morning from the event last night and the lack of sleep. He is to have shortness of breath and tightness in his chest. He denied having any significant fever or chills.   Objective: Vital signs in last 24 hours: Temp:  [96.3 F (35.7 C)-97.6 F (36.4 C)] 97.4 F (36.3 C) (07/11 0900) Pulse Rate:  [66-125] 100 (07/11 0600) Resp:  [18-36] 27 (07/11 0600) BP: (84-141)/(59-99) 89/73 mmHg (07/11 0600) SpO2:  [94 %-99 %] 95 % (07/11 0600) Weight:  [175 lb (79.379 kg)-179 lb 14.3 oz (81.6 kg)] 179 lb 14.3 oz (81.6 kg) (07/11 0500)  Intake/Output from previous day: 07/10 0701 - 07/11 0700 In: 240 [P.O.:240] Out: 450 [Urine:450] Intake/Output this shift:    General appearance: alert, cooperative, fatigued and no distress Resp: rales bilaterally Cardio: regular rate and rhythm, S1, S2 normal, no murmur, click, rub or gallop GI: soft, non-tender; bowel sounds normal; no masses,  no organomegaly Extremities: extremities normal, atraumatic, no cyanosis or edema  Lab Results:   Recent Labs  04/26/14 0655 04/27/14 0025  WBC 16.2* 17.1*  HGB 11.7* 11.8*  HCT 35.5* 36.2*  PLT 327 324   BMET  Recent Labs  04/25/14 0422 04/27/14 0025  NA 132* 134*  K 4.4 4.4  CL 93* 94*  CO2 23 21  GLUCOSE 115* 138*  BUN 21 34*  CREATININE 0.87 0.91  CALCIUM 9.0 9.1    Studies/Results: Dg Abd Portable 1v  04/25/2014   CLINICAL DATA:  Abdominal pain and distention  EXAM: PORTABLE ABDOMEN - 1 VIEW  COMPARISON:  None.  FINDINGS: Iodinated contrast fills the bladder. No disproportionate dilatation of bowel. Small calcified densities project over the right renal shadow. Pleural thickening at the right  lung base suggesting pleural effusion. No obvious free intraperitoneal gas.  IMPRESSION: Nonobstructive bowel gas pattern.  Possible right nephrolithiasis.   Electronically Signed   By: Maryclare Bean M.D.   On: 04/25/2014 10:27    Medications: I have reviewed the patient's current medications.  Assessment/Plan: 1) metastatic poorly differentiated carcinoma questionable for small cell lung cancer: He is status post 1 high-dose fraction of radiotherapy to the chest yesterday under the care of Dr. Pablo Ledger. He was supposed to start the first cycle of her systemic chemotherapy today with reduced dose carboplatin and etoposide but because of the event last night I will plan to his treatment for now until improvement in his condition and good control of his heart rate. 2) atrial flutter/fibrillation: Followed by cardiology and currently on IV amiodarone. She feels a little bit better today. 3) IV access: The patient may benefit from a Port-A-Cath placement after good control of his cardiac condition. Thank you for taking good care of Mr. Lanphere, I will continue to follow up the patient with you and assist in his management an as-needed basis.    LOS: 9 days    Latrese Carolan K. 04/27/2014

## 2014-04-27 NOTE — Progress Notes (Signed)
[facility] PROGRESS NOTE   Dillon Willis [id number]:[id number] DOB: December 17, 1940 DOA: 05/09/2014 PCP: Marco Collie, MD  HPI/Subjective: Has mild chest tightness, he had it since admitted to the hospital.  Assessment/Plan: Active Problems:   Small cell lung cancer   Pericardial effusion with cardiac tamponade   SVC syndrome   Atrial fibrillation    Atrial fibrillation with RVR -In and out of atrial fibrillation with RVR since 04/20/2014. -Difficult to control with soft blood pressure limiting the options for rate control. -Patient had pericardial tamponade and pericardial window. -Currently on amiodarone drip, digoxin added, appreciate cardiology help.  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 -Seen by medical radiation oncology, started on palliative radiation. -Soon to be started on chemotherapy.  SVC syndrome -Secondary to the small cell lung cancer, patient is receiving palliative radiation. -Secondary to both pressure from lymph nodes and SVC thrombosis  Cardiac tamponade -Patient has history of pericardial effusion which turned into cardiac tamponade on 05/11/2014. -Had emergent pericardial window and bronchoscopy done by Dr. Pilar Plate height on 7/2.  SVC/RA thrombosis -Patient started on anticoagulation, INR is therapeutic.  Code Status: Full code Family Communication: Plan discussed with the patient. Disposition Plan: Remains inpatient   Consultants:  Cards.  Medical Oncology.  Radiation Oncology  Procedures:  Radiation  Antibiotics:  None   Objective: Filed Vitals:   04/27/14 0900  BP:   Pulse:   Temp: 97.4 F (36.3 C)  Resp:     Intake/Output Summary (Last 24 hours) at 04/27/14 0928 Last data filed at 04/26/14 1300  Gross per 24 hour  Intake    120 ml  Output    250 ml  Net   -130 ml   Filed Weights   04/26/14 1500 04/27/14 0000 04/27/14 0500  Weight: 79.379 kg (175 lb) 80.5 kg (177  lb 7.5 oz) 81.6 kg (179 lb 14.3 oz)    Exam: General: Alert and awake, oriented x3, not in any acute distress. HEENT: anicteric sclera, pupils reactive to light and accommodation, EOMI CVS: S1-S2 clear, no murmur rubs or gallops Chest: clear to auscultation bilaterally, no wheezing, rales or rhonchi Abdomen: soft nontender, nondistended, normal bowel sounds, no organomegaly Extremities: no cyanosis, clubbing or edema noted bilaterally Neuro: Cranial nerves II-XII intact, no focal neurological deficits  Data Reviewed: Basic Metabolic Panel:  Recent Labs Lab 04/21/14 0246 04/22/14 0332 04/24/14 0347 04/25/14 0422 04/27/14 0025  NA 130* 129* 132* 132* 134*  K 4.4 3.9 3.9 4.4 4.4  CL 94* 92* 94* 93* 94*  CO2 21 21 20 23 21   GLUCOSE 118* 109* 99 115* 138*  BUN 24* 22 17 21  34*  CREATININE 0.88 0.85 0.71 0.87 0.91  CALCIUM 8.7 8.5 8.6 9.0 9.1  MG  --  2.2  --   --  2.5   Liver Function Tests: No results found for this basename: AST, ALT, ALKPHOS, BILITOT, PROT, ALBUMIN,  in the last 168 hours No results found for this basename: LIPASE, AMYLASE,  in the last 168 hours No results found for this basename: AMMONIA,  in the last 168 hours CBC:  Recent Labs Lab 04/23/14 0519 04/24/14 0347 04/25/14 0422 04/26/14 0655 04/27/14 0025  WBC 16.7* 13.2* 14.9* 16.2* 17.1*  HGB 11.6* 11.5* 11.9* 11.7* 11.8*  HCT 34.2* 34.8* 35.8* 35.5* 36.2*  MCV 92.4 95.3 94.5 94.7 94.8  PLT 341 296 307 327 324   Cardiac Enzymes: No results found for this basename: CKTOTAL, CKMB, CKMBINDEX, TROPONINI,  in the last 168 hours BNP (last 3 results)  Recent Labs  04/17/14 1715 05/13/2014 1030  PROBNP 1390.0* 1408.0*   CBG:  Recent Labs Lab 04/23/14 1605 04/23/14 2128 04/24/14 0600 04/25/14 1118 04/26/14 1118  GLUCAP 126* 96 101* 125* 141*    Micro Recent Results (from the past 240 hour(s))  MRSA PCR SCREENING     Status: None   Collection Time    05/08/2014 10:17 AM      Result Value  Ref Range Status   MRSA by PCR NEGATIVE  NEGATIVE Final   Comment:            The GeneXpert MRSA Assay (FDA     approved for NASAL specimens     only), is one component of a     comprehensive MRSA colonization     surveillance program. It is not     intended to diagnose MRSA     infection nor to guide or     monitor treatment for     MRSA infections.  BODY FLUID CULTURE     Status: None   Collection Time    05/04/2014  4:11 PM      Result Value Ref Range Status   Specimen Description FLUID PERICARDIAL   Final   Special Requests 50ML FLUID   Final   Gram Stain     Final   Value: WBC PRESENT,BOTH PMN AND MONONUCLEAR     NO ORGANISMS SEEN     Performed at [facility]   Culture     Final   Value: NO GROWTH 3 DAYS     Performed at [facility]   Report Status 04/21/2014 FINAL   Final  TISSUE CULTURE     Status: None   Collection Time    05/15/2014  5:29 PM      Result Value Ref Range Status   Specimen Description TISSUE PERICARDIUM   Final   Special Requests NONE   Final   Gram Stain     Final   Value: RARE WBC PRESENT, PREDOMINANTLY PMN     NO ORGANISMS SEEN     Performed at [facility]   Culture     Final   Value: NO GROWTH 3 DAYS     Performed at [facility]   Report Status 04/22/2014 FINAL   Final     Studies: Dg Abd Portable 1v  04/25/2014   CLINICAL DATA:  Abdominal pain and distention  EXAM: PORTABLE ABDOMEN - 1 VIEW  COMPARISON:  None.  FINDINGS: Iodinated contrast fills the bladder. No disproportionate dilatation of bowel. Small calcified densities project over the right renal shadow. Pleural thickening at the right lung base suggesting pleural effusion. No obvious free intraperitoneal gas.  IMPRESSION: Nonobstructive bowel gas pattern.  Possible right nephrolithiasis.   Electronically Signed   By: Maryclare Bean M.D.   On: 04/25/2014 10:27    Scheduled Meds: . bisacodyl  10 mg Oral Daily  . bisoprolol  2.5 mg Oral Daily  . coumadin book    Does not apply Once  . digoxin  0.25 mg Intravenous Q6H  . docusate sodium  100 mg Oral BID  . montelukast  10 mg Oral q morning - 10a  . pantoprazole  40 mg Oral Daily  . senna-docusate  1 tablet Oral QHS  . Warfarin - Pharmacist Dosing Inpatient   Does not apply q1800   Continuous Infusions: . amiodarone 30 mg/hr (04/27/14 0842)  .  dextrose 5 % and 0.9% NaCl 10 mL/hr at 04/21/14 1000       Time spent: 35 minutes    [facility]  [facility] Pager [phone] If 7PM-7AM, please contact night-coverage at [email or web address], password [facility] 04/27/2014, 9:28 AM  LOS: 9 days

## 2014-04-27 NOTE — Progress Notes (Signed)
Cardiology paged regarding runs of [facility] the longest being 9 beats. Put in orders for CBC and mag levels.

## 2014-04-28 LAB — BASIC METABOLIC PANEL
ANION GAP: 18 — AB (ref 5–15)
BUN: 41 mg/dL — ABNORMAL HIGH (ref 6–23)
CALCIUM: 9 mg/dL (ref 8.4–10.5)
CO2: 22 mEq/L (ref 19–32)
CREATININE: 0.95 mg/dL (ref 0.50–1.35)
Chloride: 94 mEq/L — ABNORMAL LOW (ref 96–112)
GFR calc non Af Amer: 81 mL/min — ABNORMAL LOW (ref >90)
Glucose, Bld: 151 mg/dL — ABNORMAL HIGH (ref 70–99)
Potassium: 4.8 mEq/L (ref 3.7–5.3)
Sodium: 134 mEq/L — ABNORMAL LOW (ref 137–147)

## 2014-04-28 LAB — CBC
HCT: 37.7 % — ABNORMAL LOW (ref 39.0–52.0)
Hemoglobin: 12.2 g/dL — ABNORMAL LOW (ref 13.0–17.0)
MCH: 31.1 pg (ref 26.0–34.0)
MCHC: 32.4 g/dL (ref 30.0–36.0)
MCV: 96.2 fL (ref 78.0–100.0)
Platelets: 297 10*3/uL (ref 150–400)
RBC: 3.92 MIL/uL — ABNORMAL LOW (ref 4.22–5.81)
RDW: 15.2 % (ref 11.5–15.5)
WBC: 18.6 10*3/uL — ABNORMAL HIGH (ref 4.0–10.5)

## 2014-04-28 LAB — PROTIME-INR
INR: 5.29 (ref 0.00–1.49)
PROTHROMBIN TIME: 48.5 s — AB (ref 11.6–15.2)

## 2014-04-28 MED ORDER — AMIODARONE HCL 200 MG PO TABS
400.0000 mg | ORAL_TABLET | Freq: Three times a day (TID) | ORAL | Status: DC
  Administered 2014-04-28 (×3): 400 mg via ORAL
  Filled 2014-04-28 (×4): qty 2

## 2014-04-28 MED ORDER — DIGOXIN 125 MCG PO TABS
0.1250 mg | ORAL_TABLET | Freq: Every day | ORAL | Status: DC
  Administered 2014-04-28: 0.125 mg via ORAL
  Filled 2014-04-28: qty 1

## 2014-04-28 NOTE — Progress Notes (Signed)
Subjective: Mr. Stonehocker is seen and examined today. He is feeling a little bit better but continues to have tightness in his chest. He denied having any significant shortness of breath. He has been on sinus rhythm since yesterday after starting IV amiodarone. He denied having any significant fever or chills. He has no nausea or vomiting.  Objective: Vital signs in last 24 hours: Temp:  [96.9 F (36.1 C)-97.8 F (36.6 C)] 97.2 F (36.2 C) (07/12 0800) Pulse Rate:  [65-79] 73 (07/12 1000) Resp:  [20-31] 21 (07/12 1000) BP: (87-132)/(61-95) 123/71 mmHg (07/12 0800) SpO2:  [94 %-98 %] 98 % (07/12 1000) Weight:  [177 lb 7.5 oz (80.5 kg)] 177 lb 7.5 oz (80.5 kg) (07/12 0400)  Intake/Output from previous day: 07/11 0701 - 07/12 0700 In: 1750.8 [P.O.:180; I.V.:950.8; IV Piggyback:500] Out: 600 [Urine:600] Intake/Output this shift: Total I/O In: 200.1 [I.V.:200.1] Out: -   General appearance: alert, cooperative, fatigued and no distress Resp: clear to auscultation bilaterally Cardio: regular rate and rhythm, S1, S2 normal, no murmur, click, rub or gallop GI: soft, non-tender; bowel sounds normal; no masses,  no organomegaly Extremities: extremities normal, atraumatic, no cyanosis or edema  Lab Results:   Recent Labs  04/27/14 0025 04/28/14 0355  WBC 17.1* 18.6*  HGB 11.8* 12.2*  HCT 36.2* 37.7*  PLT 324 297   BMET  Recent Labs  04/27/14 0025 04/28/14 0355  NA 134* 134*  K 4.4 4.8  CL 94* 94*  CO2 21 22  GLUCOSE 138* 151*  BUN 34* 41*  CREATININE 0.91 0.95  CALCIUM 9.1 9.0    Studies/Results: No results found.  Medications: I have reviewed the patient's current medications.   Assessment/Plan: 1) Metastatic poorly differentiated carcinoma questionable for small cell lung cancer: He is status post 1 high-dose fraction of radiotherapy to the chest on 04/26/2014 under the care of Dr. Pablo Ledger.  His chemotherapy with carboplatin and etoposide is currently on hold but  I may consider starting his treatment early next week if his condition improved.  2) atrial flutter/fibrillation: Followed by cardiology and currently on IV amiodarone which is expected to be switching to oral in the prone with small dose of digoxin. She feels a little bit better today.  3) IV access: The patient may benefit from a Port-A-Cath placement after good control of his cardiac condition.    LOS: 10 days    Shantella Blubaugh K. 04/28/2014

## 2014-04-28 NOTE — Progress Notes (Signed)
ANTICOAGULATION CONSULT NOTE - Follow Up Consult  Pharmacy Consult for Coumadin Indication: clot in SVC and right atrium  No Known Allergies  Patient Measurements: Height: 5\' 10"  (177.8 cm) Weight: 177 lb 7.5 oz (80.5 kg) IBW/kg (Calculated) : 73  Vital Signs: Temp: 96.9 F (36.1 C) (07/12 0400) Temp src: Oral (07/12 0400) BP: 96/66 mmHg (07/12 0500) Pulse Rate: 66 (07/12 0500)  Labs:  Recent Labs  04/26/14 0655 04/27/14 0025 04/28/14 0355  HGB 11.7* 11.8* 12.2*  HCT 35.5* 36.2* 37.7*  PLT 327 324 297  LABPROT 37.5* 44.7* 48.5*  INR 3.81* 4.76* 5.29*  CREATININE  --  0.91 0.95   Estimated Creatinine Clearance: 72.6 ml/min (by C-G formula based on Cr of 0.95).  Assessment: Dillon Willis continues on warfarin for clot in the SVC and right atrium 2/2 malignancy.  INR with significant increase- likely secondary to receiving higher doses of warfarin when started + patient being on amiodarone. Heparin infusion 7/3-7/8 with Warfarin begun 7/5;has received 7.5,7.5,5,2.5,2.5mg  Warfarin.  INR supratherapeutic 7/10 - Warfarin held, patient transferred back to [facility]  INR continues to rise - 5.29 this am, likely due to Amiodarone  CBC unchanged  Goal of Therapy:  INR 2-3   Plan:   Continue to hold Warfarin  Daily PT/INR  Monitor CBC, signs of bleed

## 2014-04-28 NOTE — Progress Notes (Signed)
  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 was transferred to [facility] for radiation treatment after he was stabilized from a cardiovascular standpoing. We were able to simulate him and administer a single fraction of radiation. Subsequent chest x-rays showed an improvement in aeration of the right upper lobe. Unfortunately, he continued to decline and died during his hospitalization.   ------------------------------------------------  Thea Silversmith, MD

## 2014-04-28 NOTE — Progress Notes (Signed)
[facility] PROGRESS NOTE   JOSEF TOURIGNY VOZ:[id number] DOB: 13-Apr-1941 DOA: 04/21/2014 PCP: Marco Collie, MD  HPI/Subjective: Heart rate is controlled since yesterday, still complaining about chest tightness. Per cardiology switch amiodarone to oral and start digoxin. If heart rate is controlled he probably can be discharged in am to F/U with medical and radiation oncology as outpatient. PT to check on the patient today.  Assessment/Plan: Active Problems:   Small cell lung cancer   Pericardial effusion with cardiac tamponade   SVC syndrome   Atrial fibrillation    Atrial fibrillation with RVR -In and out of atrial fibrillation with RVR since 04/20/2014. -Difficult to control with soft blood pressure limiting the options for rate control. -Patient had pericardial tamponade and pericardial window. -Currently on amiodarone drip, digoxin added, switch to oral amiodarone and digoxin.  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 -Seen by medical radiation oncology, started on palliative radiation. -Per oncology chemotherapy will be started after his functional status improves and has heart rate controlled.  SVC syndrome -Secondary to the small cell lung cancer, patient is receiving palliative radiation. -Secondary to both pressure from lymph nodes and SVC thrombosis. -Started on palliative radiation. Anticoagulation continued.  Cardiac tamponade -Patient has history of pericardial effusion which turned into cardiac tamponade on 04/28/2014. -Had emergent pericardial window and bronchoscopy done by Dr. Nils Pyle on 7/2.  SVC/RA thrombosis -Patient started on anticoagulation, INR is therapeutic.  Code Status: Full code Family Communication: Plan discussed with the patient. Disposition Plan: Remains inpatient   Consultants:  Cards.  Medical Oncology.  Radiation  Oncology  Procedures:  Radiation  Antibiotics:  None   Objective: Filed Vitals:   04/28/14 1000  BP:   Pulse: 73  Temp:   Resp: 21    Intake/Output Summary (Last 24 hours) at 04/28/14 1059 Last data filed at 04/28/14 1000  Gross per 24 hour  Intake 1900.8 ml  Output    400 ml  Net 1500.8 ml   Filed Weights   04/27/14 0000 04/27/14 0500 04/28/14 0400  Weight: 80.5 kg (177 lb 7.5 oz) 81.6 kg (179 lb 14.3 oz) 80.5 kg (177 lb 7.5 oz)    Exam: General: Alert and awake, oriented x3, not in any acute distress. HEENT: anicteric sclera, pupils reactive to light and accommodation, EOMI CVS: S1-S2 clear, no murmur rubs or gallops Chest: clear to auscultation bilaterally, no wheezing, rales or rhonchi Abdomen: soft nontender, nondistended, normal bowel sounds, no organomegaly Extremities: no cyanosis, clubbing or edema noted bilaterally Neuro: Cranial nerves II-XII intact, no focal neurological deficits  Data Reviewed: Basic Metabolic Panel:  Recent Labs Lab 04/22/14 0332 04/24/14 0347 04/25/14 0422 04/27/14 0025 04/28/14 0355  NA 129* 132* 132* 134* 134*  K 3.9 3.9 4.4 4.4 4.8  CL 92* 94* 93* 94* 94*  CO2 21 20 23 21 22   GLUCOSE 109* 99 115* 138* 151*  BUN 22 17 21  34* 41*  CREATININE 0.85 0.71 0.87 0.91 0.95  CALCIUM 8.5 8.6 9.0 9.1 9.0  MG 2.2  --   --  2.5  --    Liver Function Tests: No results found for this basename: AST, ALT, ALKPHOS, BILITOT, PROT, ALBUMIN,  in the last 168 hours No results found for this basename: LIPASE, AMYLASE,  in the last 168 hours No results found for this basename: AMMONIA,  in the last 168 hours CBC:  Recent Labs Lab 04/24/14 0347 04/25/14 0422 04/26/14 0655 04/27/14 0025 04/28/14 0355  WBC 13.2* 14.9* 16.2* 17.1* 18.6*  HGB 11.5* 11.9* 11.7* 11.8* 12.2*  HCT 34.8* 35.8* 35.5* 36.2* 37.7*  MCV 95.3 94.5 94.7 94.8 96.2  PLT 296 307 327 324 297   Cardiac Enzymes: No results found for this basename: CKTOTAL, CKMB,  CKMBINDEX, TROPONINI,  in the last 168 hours BNP (last 3 results)  Recent Labs  04/17/14 1715 04/29/2014 1030  PROBNP 1390.0* 1408.0*   CBG:  Recent Labs Lab 04/23/14 1605 04/23/14 2128 04/24/14 0600 04/25/14 1118 04/26/14 1118  GLUCAP 126* 96 101* 125* 141*    Micro Recent Results (from the past 240 hour(s))  BODY FLUID CULTURE     Status: None   Collection Time    04/17/2014  4:11 PM      Result Value Ref Range Status   Specimen Description FLUID PERICARDIAL   Final   Special Requests 50ML FLUID   Final   Gram Stain     Final   Value: WBC PRESENT,BOTH PMN AND MONONUCLEAR     NO ORGANISMS SEEN     Performed at [facility]   Culture     Final   Value: NO GROWTH 3 DAYS     Performed at [facility]   Report Status 04/21/2014 FINAL   Final  TISSUE CULTURE     Status: None   Collection Time    05/08/2014  5:29 PM      Result Value Ref Range Status   Specimen Description TISSUE PERICARDIUM   Final   Special Requests NONE   Final   Gram Stain     Final   Value: RARE WBC PRESENT, PREDOMINANTLY PMN     NO ORGANISMS SEEN     Performed at [facility]   Culture     Final   Value: NO GROWTH 3 DAYS     Performed at [facility]   Report Status 04/22/2014 FINAL   Final     Studies: No results found.  Scheduled Meds: . amiodarone  400 mg Oral TID  . antiseptic oral rinse  15 mL Mouth Rinse BID  . bisacodyl  10 mg Oral Daily  . bisoprolol  2.5 mg Oral Daily  . coumadin book   Does not apply Once  . digoxin  0.125 mg Oral Daily  . docusate sodium  100 mg Oral BID  . montelukast  10 mg Oral q morning - 10a  . pantoprazole  40 mg Oral Daily  . senna-docusate  1 tablet Oral QHS  . Warfarin - Pharmacist Dosing Inpatient   Does not apply q1800   Continuous Infusions: . dextrose 5 % and 0.9% NaCl 50 mL (04/27/14 2000)       Time spent: 35 minutes    [facility]  [facility] Pager [phone] If 7PM-7AM, please contact  night-coverage at [email or web address], password [facility] 04/28/2014, 10:59 AM  LOS: 10 days

## 2014-04-28 NOTE — Progress Notes (Signed)
CRITICAL VALUE ALERT  Critical value received:  5.29 INR  Date of notification:  04/28/14  Time of notification:  4035  Critical value read back:Yes.    Nurse who received alert:  Darrin Nipper, RN  MD notified (1st page):  [facility] on call - Rogue Bussing   Time of first page:  [phone]  Responding MD:  Rogue Bussing  Time MD responded:  [phone]

## 2014-04-28 NOTE — Progress Notes (Signed)
Subjective:  C/o weakness but no shortness of breath today. No real chest pain but has mild tightness. He was in sinus all day yesterday after being on IV amiodarone and receiving some digoxin. Chemotherapy was put on hold.  Objective:  Vital Signs in the last 24 hours: BP 96/66  Pulse 66  Temp(Src) 96.9 F (36.1 C) (Oral)  Resp 31  Ht 5\' 10"  (1.778 m)  Wt 80.5 kg (177 lb 7.5 oz)  BMI 25.46 kg/m2  SpO2 98%  Physical Exam: Pleasant white male in NADLungs:  Clear  Cardiac: Rapid irregular rhythm, normal S1 and S2, no S3 Extremities:  No edema present  Intake/Output from previous day: 07/11 0701 - 07/12 0700 In: 1617.4 [P.O.:180; I.V.:817.4; IV Piggyback:500] Out: 600 [Urine:600] Weight Filed Weights   04/27/14 0000 04/27/14 0500 04/28/14 0400  Weight: 80.5 kg (177 lb 7.5 oz) 81.6 kg (179 lb 14.3 oz) 80.5 kg (177 lb 7.5 oz)    Lab Results: Basic Metabolic Panel:  Recent Labs  04/27/14 0025 04/28/14 0355  NA 134* 134*  K 4.4 4.8  CL 94* 94*  CO2 21 22  GLUCOSE 138* 151*  BUN 34* 41*  CREATININE 0.91 0.95    CBC:  Recent Labs  04/27/14 0025 04/28/14 0355  WBC 17.1* 18.6*  HGB 11.8* 12.2*  HCT 36.2* 37.7*  MCV 94.8 96.2  PLT 324 297    BNP    Component Value Date/Time   PROBNP 1408.0* 05/17/2014 1030    Telemetry: Normal sinus rhythm, no recurrence of atrial fibrillation.  Assessment/Plan:  1. Recurrent atrial fibrillation with rapid ventricular response on amiodarone currently in sinus rhythm 2. Intermittent hypotension 3. 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 Prior pericardial tamponade  Recommendations:  Let's try to put him back on oral amiodarone. I will place on a small dose of digoxin to help rate control if he goes back into atrial fibrillation at this time particularly light of soft blood pressures.     Kerry Hough  MD [facility]  04/28/2014, 8:23 AM

## 2014-04-29 ENCOUNTER — Inpatient Hospital Stay (HOSPITAL_COMMUNITY): Payer: Medicare Other

## 2014-04-29 DIAGNOSIS — E43 Unspecified severe protein-calorie malnutrition: Secondary | ICD-10-CM | POA: Insufficient documentation

## 2014-04-29 DIAGNOSIS — J9600 Acute respiratory failure, unspecified whether with hypoxia or hypercapnia: Secondary | ICD-10-CM | POA: Diagnosis not present

## 2014-04-29 DIAGNOSIS — C3490 Malignant neoplasm of unspecified part of unspecified bronchus or lung: Principal | ICD-10-CM

## 2014-04-29 LAB — TYPE AND SCREEN
ABO/RH(D): O POS
Antibody Screen: NEGATIVE

## 2014-04-29 LAB — BLOOD GAS, ARTERIAL
Acid-base deficit: 12.4 mmol/L — ABNORMAL HIGH (ref 0.0–2.0)
Bicarbonate: 14.6 mEq/L — ABNORMAL LOW (ref 20.0–24.0)
DRAWN BY: 257701
FIO2: 1 %
LHR: 14 {breaths}/min
O2 SAT: 94.6 %
PCO2 ART: 38.3 mmHg (ref 35.0–45.0)
PEEP: 5 cmH2O
Patient temperature: 98.6
TCO2: 13.7 mmol/L (ref 0–100)
VT: 580 mL
pH, Arterial: 7.204 — ABNORMAL LOW (ref 7.350–7.450)
pO2, Arterial: 96.9 mmHg (ref 80.0–100.0)

## 2014-04-29 LAB — CBC
HEMATOCRIT: 40.3 % (ref 39.0–52.0)
Hemoglobin: 13 g/dL (ref 13.0–17.0)
MCH: 31.5 pg (ref 26.0–34.0)
MCHC: 32.3 g/dL (ref 30.0–36.0)
MCV: 97.6 fL (ref 78.0–100.0)
Platelets: 303 10*3/uL (ref 150–400)
RBC: 4.13 MIL/uL — ABNORMAL LOW (ref 4.22–5.81)
RDW: 15.2 % (ref 11.5–15.5)
WBC: 21.1 10*3/uL — ABNORMAL HIGH (ref 4.0–10.5)

## 2014-04-29 LAB — CBC WITH DIFFERENTIAL/PLATELET
BASOS PCT: 0 % (ref 0–1)
Basophils Absolute: 0 10*3/uL (ref 0.0–0.1)
EOS ABS: 0 10*3/uL (ref 0.0–0.7)
Eosinophils Relative: 0 % (ref 0–5)
HCT: 38.2 % — ABNORMAL LOW (ref 39.0–52.0)
Hemoglobin: 12.5 g/dL — ABNORMAL LOW (ref 13.0–17.0)
Lymphocytes Relative: 5 % — ABNORMAL LOW (ref 12–46)
Lymphs Abs: 1.4 10*3/uL (ref 0.7–4.0)
MCH: 32.2 pg (ref 26.0–34.0)
MCHC: 32.7 g/dL (ref 30.0–36.0)
MCV: 98.5 fL (ref 78.0–100.0)
Monocytes Absolute: 1 10*3/uL (ref 0.1–1.0)
Monocytes Relative: 3 % (ref 3–12)
NEUTROS PCT: 92 % — AB (ref 43–77)
Neutro Abs: 27.1 10*3/uL — ABNORMAL HIGH (ref 1.7–7.7)
Platelets: 239 10*3/uL (ref 150–400)
RBC: 3.88 MIL/uL — AB (ref 4.22–5.81)
RDW: 15.5 % (ref 11.5–15.5)
WBC: 29.4 10*3/uL — ABNORMAL HIGH (ref 4.0–10.5)

## 2014-04-29 LAB — PROTIME-INR
INR: 6.45 — AB (ref 0.00–1.49)
Prothrombin Time: 56.6 seconds — ABNORMAL HIGH (ref 11.6–15.2)

## 2014-04-29 LAB — BASIC METABOLIC PANEL
ANION GAP: 18 — AB (ref 5–15)
BUN: 47 mg/dL — ABNORMAL HIGH (ref 6–23)
CALCIUM: 9.1 mg/dL (ref 8.4–10.5)
CO2: 23 mEq/L (ref 19–32)
CREATININE: 1.02 mg/dL (ref 0.50–1.35)
Chloride: 94 mEq/L — ABNORMAL LOW (ref 96–112)
GFR calc Af Amer: 83 mL/min — ABNORMAL LOW (ref >90)
GFR calc non Af Amer: 71 mL/min — ABNORMAL LOW (ref >90)
Glucose, Bld: 156 mg/dL — ABNORMAL HIGH (ref 70–99)
Potassium: 5.1 mEq/L (ref 3.7–5.3)
Sodium: 135 mEq/L — ABNORMAL LOW (ref 137–147)

## 2014-04-29 LAB — ABO/RH: ABO/RH(D): O POS

## 2014-04-29 MED ORDER — LIDOCAINE HCL (CARDIAC) 20 MG/ML IV SOLN
INTRAVENOUS | Status: AC
  Filled 2014-04-29: qty 5

## 2014-04-29 MED ORDER — FUROSEMIDE 10 MG/ML IJ SOLN
40.0000 mg | Freq: Once | INTRAMUSCULAR | Status: AC
  Administered 2014-04-29: 40 mg via INTRAVENOUS
  Filled 2014-04-29: qty 4

## 2014-04-29 MED ORDER — BIOTENE DRY MOUTH MT LIQD
15.0000 mL | Freq: Four times a day (QID) | OROMUCOSAL | Status: DC
  Administered 2014-04-30 (×2): 15 mL via OROMUCOSAL

## 2014-04-29 MED ORDER — VANCOMYCIN HCL IN DEXTROSE 1-5 GM/200ML-% IV SOLN
1000.0000 mg | Freq: Once | INTRAVENOUS | Status: AC
  Administered 2014-04-29: 1000 mg via INTRAVENOUS

## 2014-04-29 MED ORDER — DIGOXIN 125 MCG PO TABS: 0.1250 mg | ORAL_TABLET | Freq: Every day | ORAL | Status: DC

## 2014-04-29 MED ORDER — SUCCINYLCHOLINE CHLORIDE 20 MG/ML IJ SOLN
INTRAMUSCULAR | Status: AC
  Filled 2014-04-29: qty 1

## 2014-04-29 MED ORDER — DIGOXIN 125 MCG PO TABS
0.2500 mg | ORAL_TABLET | Freq: Once | ORAL | Status: AC
  Administered 2014-04-29: 0.25 mg via ORAL
  Filled 2014-04-29: qty 2

## 2014-04-29 MED ORDER — PIPERACILLIN-TAZOBACTAM 3.375 G IVPB
3.3750 g | Freq: Three times a day (TID) | INTRAVENOUS | Status: AC
  Administered 2014-04-29: 3.375 g via INTRAVENOUS
  Filled 2014-04-29: qty 50

## 2014-04-29 MED ORDER — ENSURE COMPLETE PO LIQD: 237.0000 mL | Freq: Three times a day (TID) | ORAL | Status: DC

## 2014-04-29 MED ORDER — ROCURONIUM BROMIDE 50 MG/5ML IV SOLN
INTRAVENOUS | Status: AC
  Filled 2014-04-29: qty 2

## 2014-04-29 MED ORDER — SODIUM CHLORIDE 0.9 % IV BOLUS (SEPSIS)
1000.0000 mL | Freq: Once | INTRAVENOUS | Status: AC
  Administered 2014-04-29: 1000 mL via INTRAVENOUS

## 2014-04-29 MED ORDER — AMIODARONE HCL IN DEXTROSE 360-4.14 MG/200ML-% IV SOLN
60.0000 mg/h | INTRAVENOUS | Status: AC
  Administered 2014-04-29: 60 mg/h via INTRAVENOUS
  Filled 2014-04-29: qty 200

## 2014-04-29 MED ORDER — FENTANYL CITRATE 0.05 MG/ML IJ SOLN
50.0000 ug | INTRAMUSCULAR | Status: DC | PRN
  Administered 2014-04-29: 50 ug via INTRAVENOUS
  Filled 2014-04-29: qty 2

## 2014-04-29 MED ORDER — ETOMIDATE 2 MG/ML IV SOLN
INTRAVENOUS | Status: AC
  Administered 2014-04-29: 20 mg
  Filled 2014-04-29: qty 20

## 2014-04-29 MED ORDER — FENTANYL CITRATE 0.05 MG/ML IJ SOLN
25.0000 ug | INTRAMUSCULAR | Status: DC | PRN
  Administered 2014-04-29 – 2014-04-30 (×2): 75 ug via INTRAVENOUS
  Filled 2014-04-29 (×2): qty 2

## 2014-04-29 MED ORDER — PIPERACILLIN-TAZOBACTAM 3.375 G IVPB
3.3750 g | Freq: Three times a day (TID) | INTRAVENOUS | Status: DC
  Administered 2014-04-29 – 2014-04-30 (×2): 3.375 g via INTRAVENOUS
  Filled 2014-04-29 (×2): qty 50

## 2014-04-29 MED ORDER — PANTOPRAZOLE SODIUM 40 MG IV SOLR
40.0000 mg | Freq: Every day | INTRAVENOUS | Status: DC
Start: 2014-04-30 — End: 2014-04-30

## 2014-04-29 MED ORDER — FENTANYL CITRATE 0.05 MG/ML IJ SOLN
INTRAMUSCULAR | Status: AC
  Filled 2014-04-29: qty 2

## 2014-04-29 MED ORDER — VANCOMYCIN HCL IN DEXTROSE 1-5 GM/200ML-% IV SOLN
1000.0000 mg | Freq: Two times a day (BID) | INTRAVENOUS | Status: DC
  Administered 2014-04-30: 1000 mg via INTRAVENOUS
  Filled 2014-04-29: qty 200

## 2014-04-29 MED ORDER — SODIUM CHLORIDE 0.9 % IV SOLN
INTRAVENOUS | Status: DC
  Administered 2014-04-29 – 2014-04-30 (×3): via INTRAVENOUS

## 2014-04-29 MED ORDER — CHLORHEXIDINE GLUCONATE 0.12 % MT SOLN
15.0000 mL | Freq: Two times a day (BID) | OROMUCOSAL | Status: DC
  Administered 2014-04-29: 15 mL via OROMUCOSAL
  Filled 2014-04-29: qty 15

## 2014-04-29 MED ORDER — NOREPINEPHRINE BITARTRATE 1 MG/ML IV SOLN
2.0000 ug/min | INTRAVENOUS | Status: DC
  Administered 2014-04-29: 4 ug/min via INTRAVENOUS
  Filled 2014-04-29 (×2): qty 16

## 2014-04-29 MED ORDER — MIDAZOLAM HCL 2 MG/2ML IJ SOLN
INTRAMUSCULAR | Status: AC
  Filled 2014-04-29: qty 2

## 2014-04-29 MED ORDER — GUAIFENESIN-DM 100-10 MG/5ML PO SYRP
5.0000 mL | ORAL_SOLUTION | ORAL | Status: DC | PRN
  Administered 2014-04-29: 5 mL via ORAL
  Filled 2014-04-29: qty 10

## 2014-04-29 MED ORDER — AMIODARONE HCL IN DEXTROSE 360-4.14 MG/200ML-% IV SOLN: 30.0000 mg/h | INTRAVENOUS | Status: DC

## 2014-04-29 NOTE — Procedures (Signed)
Intubation Procedure Note Dillon Willis [id number] [birth year]  Procedure: Intubation Indications: Respiratory insufficiency  Procedure Details Consent: Risks of procedure as well as the alternatives and risks of each were explained to the (patient/caregiver).  Consent for procedure obtained. Time Out: Verified patient identification, verified procedure, site/side was marked, verified correct patient position, special equipment/implants available, medications/allergies/relevent history reviewed, required imaging and test results available.  Performed  Maximum sterile technique was used including antiseptics, cap, gloves, gown, hand hygiene, mask and sheet.  MAC 3 glide scope #8   Evaluation Hemodynamic Status: BP stable throughout; O2 sats: stable throughout Patient's Current Condition: stable Complications: No apparent complications Patient did tolerate procedure well. Chest X-ray ordered to verify placement.  CXR: pending.   BABCOCK,PETE 04/29/2014  Baltazar Apo, MD, PhD 04/23/2014, 11:47 AM [facility] [phone] or if no answer [phone]

## 2014-04-29 NOTE — Progress Notes (Signed)
Warming blanket removed

## 2014-04-29 NOTE — Progress Notes (Signed)
PULMONARY / CRITICAL CARE MEDICINE   Name: XAIDEN FLEIG MRN: [id number] DOB: 1941-01-22    ADMISSION DATE:  05/10/2014  PRIMARY SERVICE: PCCM  PCP : Dr. Nyra Capes   CHIEF COMPLAINT:  Severe shortness of breath and dizziness   BRIEF PATIENT DESCRIPTION: 67 yowm smoker referred to office 04/17/14 for intermittent hemoptysis  And RUL lung mass complicated by moderate pericardial effusion and hyponatremia and hypotension.    SIGNIFICANT EVENTS / STUDIES:  CTA chest 04/17/14 >neg PE, thrombus within the superior vena cava secondary to mass effect from the large right paratracheal mass. Venous return from the head and upper thorax is via collaterals.  The pericardial effusion has increased and is now moderate in size with maximal thickness of 2.6 cm. The small bilateral pleural effusions have increased.  total right upper lobe collapse. There is subsegmental atelectasis on the left. . There is bilateral adrenal enlargement 2D Echo7/2/15 >>> tamponade. 7/2 emergent pericardial window and bronchoscopy with RUL mass biopsy by Dr. Nils Pyle 2D Echo post  Window > no more tamponade, RA clot, SVC clot, LVEF 50-55% 7/4 AFib with RVR, ICU transfer held 7/7 on floor, remains on amio and heparin drip. 7/8 CT head >>>1. At least 4 enhancing lesions are evident as described. Two scratch the the largest to or in the high anterior left frontal lobe measuring 8 and 9 mm respectively. 2. Minimal posterior right ethmoid sinus disease. 7/9  Afib with RVR again, transfer to [facility] 7/10: status post 1 high-dose fraction of radiotherapy to the chest on 04/26/2014 under the care of Dr. Pablo Ledger.  7/13: developed onset of PAF w/ worsening dyspnea and hypotension.  PCCM asked to see again   LINES / TUBES: 7/2 Sub xyphoid pericardial tube >>7/4  CULTURES: 7/2 pericardial fluid >>neg 7/2 pericardial tissue>>>neg  ANTIBIOTICS: 7/2 periop cefuroxime >>off 7/12: vanc>>> 7/12: zosyn>>>  SUBJECTIVE:  In acute  distress. Can't be supine   VITAL SIGNS: Temp:  [97.4 F (36.3 C)-97.5 F (36.4 C)] 97.4 F (36.3 C) (07/13 0000) Pulse Rate:  [39-126] 125 (07/13 1200) Resp:  [16-34] 32 (07/13 1200) BP: (76-124)/(50-82) 76/50 mmHg (07/13 1200) SpO2:  [90 %-99 %] 97 % (07/13 1200) Weight:  [80.5 kg (177 lb 7.5 oz)-81.7 kg (180 lb 1.9 oz)] 80.5 kg (177 lb 7.5 oz) (07/13 0500) 2 liters  HEMODYNAMICS:   VENTILATOR SETTINGS:   INTAKE / OUTPUT: Intake/Output     07/12 0701 - 07/13 0700 07/13 0701 - 07/14 0700   P.O.     I.V. (mL/kg) 1250.1 (15.5) 283.3 (3.5)   Other     IV Piggyback     Total Intake(mL/kg) 1250.1 (15.5) 283.3 (3.5)   Urine (mL/kg/hr) 400 (0.2) 40 (0.1)   Total Output 400 40   Net +850.1 +243.3        Stool Occurrence 1 x      PHYSICAL EXAMINATION:  Gen: Up in bed, Marked distress. + orthopnea  HEENT: NCAT, EOMi, OP clear,  PULM: r bronchial breath sounds RUL, diffuse rales, ++ accessory muscle use CV: tachy irreg af  AB: BS+, soft, nontender, no hsm Ext: warm, no edema, no clubbing, no cyanosis Neuro: A&Ox4, MAEW   LABS:  CBC  Recent Labs Lab 04/27/14 0025 04/28/14 0355 04/29/14 0356  WBC 17.1* 18.6* 21.1*  HGB 11.8* 12.2* 13.0  HCT 36.2* 37.7* 40.3  PLT 324 297 303   Coag's  Recent Labs Lab 04/27/14 0025 04/28/14 0355 04/29/14 0356  INR 4.76* 5.29* 6.45*   BMET  Recent Labs Lab 04/27/14 0025 04/28/14 0355 04/29/14 0356  NA 134* 134* 135*  K 4.4 4.8 5.1  CL 94* 94* 94*  CO2 21 22 23   BUN 34* 41* 47*  CREATININE 0.91 0.95 1.02  GLUCOSE 138* 151* 156*   Electrolytes  Recent Labs Lab 04/27/14 0025 04/28/14 0355 04/29/14 0356  CALCIUM 9.1 9.0 9.1  MG 2.5  --   --    Sepsis Markers No results found for this basename: LATICACIDVEN, PROCALCITON, O2SATVEN,  in the last 168 hours ABG No results found for this basename: PHART, PCO2ART, PO2ART,  in the last 168 hours Liver Enzymes No results found for this basename: AST, ALT, ALKPHOS,  BILITOT, ALBUMIN,  in the last 168 hours Cardiac Enzymes No results found for this basename: TROPONINI, PROBNP,  in the last 168 hours Glucose  Recent Labs Lab 04/23/14 1139 04/23/14 1605 04/23/14 2128 04/24/14 0600 04/25/14 1118 04/26/14 1118  GLUCAP 97 126* 96 101* 125* 141*    Imaging Dg Chest 1 View  04/29/2014   CLINICAL DATA:  Shortness of breath, hypertension  EXAM: CHEST - 1 VIEW  COMPARISON:  Portable exam 1112 hr compared to 04/24/2014  FINDINGS: Enlargement of cardiac silhouette with pulmonary vascular congestion.  Calcified RIGHT hilar adenopathy with calcified granuloma lower RIGHT chest.  Bibasilar effusions and atelectasis.  Question minimal perihilar edema.  Slightly improved aeration in RIGHT upper lobe.  No pneumothorax.  IMPRESSION: Enlargement of cardiac silhouette with pulmonary vascular congestion and question minimal edema.  Old granulomatous disease with persistent bibasilar effusions and atelectasis.  Improving atelectasis RIGHT upper lobe.   Electronically Signed   By: Lavonia Dana M.D.   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 Can't exclude evolving PNA. But would favor edema... Of note the RUL collapse is better.  He did not tolerate BIPAP P: Anticoagulation for SVC syndrome > coumadin per pharmacy with heparin bridge (currently on hold due to supra therapeutic INR) Continue XRT Increase FIO2 Add abx if fails short term invasive ventilation would be a consideration, but I have discussed that we need to define limitations of this as risk of him being vent dependent w/ difficulty weaning off vent would be high.   CARDIOVASCULAR A:  Tamponade - resolved post pericardial window Afib with RVR - difficult to control - multiple recurrences of AFib RVR. Cards is concerned  that persistent AF may reflect extensive pericardial space metastatic involvement). But evolving PNA could also contribute to worsening AF Hypotension in setting of AF w/ RVR  Pulmonary edema P:  Cardiology following Amiodarone and dig per them Fluid challenge w/ FFP  Tele    RENAL A:  Hyponatremia, likely lung mass related Oliguria, at high risk renal failure P:  KVO fluids Monitor electrolytes closely and replete as needed  GASTROINTESTINAL A:  abd pain/ distention -->resolved  P:   Diet as tolerated  HEMATOLOGIC A:  Leukocytosis - likely stress reaction + hydrocortisone.  Improving.       Coagulopathy (coumadin induced)        SVC syndrome  P:  Monitor CBC FFP  Hold coumadin   INFECTIOUS A:  R/o HCAP vs post-obstructive PNA P:   Empiric vanc/zosyn   ENDOCRINE A:  Hyperglycemia - improved  P:   monitor  glucose on chem    NEUROLOGIC A:  Intact  P:   monitor  Long discussion. Not sure if the AF reflects his cardiac involvement OR possibly burden of a PNA.Marland Kitchen There is definitely a component of pulmonary edema which is what has caused his respiratory failure. He is in acute distress. Has limited IV access. Will start w/ FFP bolus, IV abx. It may be with these interventions we can stabalize him. He is not a candidate for diuresis given hypotension associated w/ AF. If he converts we could consider this. I have discussed some goals of care w/ patient. He would want short term invasive therapy at this point, but would not want long term ventilation. His son was at the bedside. Have encouraged them to discuss this more as if the pt gets intubated the chances that he might not come off the vent are high.    90 minutes CC time total   Baltazar Apo, MD, PhD 04/29/2014, 3:09 PM [facility] [phone] or if no answer [phone]

## 2014-04-29 NOTE — Progress Notes (Signed)
ANTICOAGULATION CONSULT NOTE - Follow Up Consult  Pharmacy Consult for Warfarin Indication: clot in SVC and right atrium  No Known Allergies  Patient Measurements: Height: 5\' 10"  (177.8 cm) Weight: 177 lb 7.5 oz (80.5 kg) IBW/kg (Calculated) : 73  Vital Signs: Temp: 97.4 F (36.3 C) (07/13 0000) Temp src: Axillary (07/13 0000) BP: 92/71 mmHg (07/13 0600) Pulse Rate: 73 (07/13 0600)  Labs:  Recent Labs  04/27/14 0025 04/28/14 0355 04/29/14 0356  HGB 11.8* 12.2* 13.0  HCT 36.2* 37.7* 40.3  PLT 324 297 303  LABPROT 44.7* 48.5* 56.6*  INR 4.76* 5.29* 6.45*  CREATININE 0.91 0.95 1.02   Estimated Creatinine Clearance: 67.6 ml/min (by C-G formula based on Cr of 1.02).  Assessment: 8 yom admitted on 7/2 with severe SOB and dizziness.  PMH of hemoptysis, RUL lung mass, pericardial effusion.  Pharmacy consulted to dose heparin initially (7/3-7/9) with Warfarin added on 7/5 for clot in the SVC and right atrium 2/2 malignancy.  INR increased significantly after high initial warfarin doses in addition to amiodarone.     Warfarin doses:  7.5, 7.5, 5, 2.5, 2.5mg .  Last dose on 7/9, then held d/t elevated INR.  INR 6.45, SUPRAtherapeutic and increased despite holding warfarin doses.  Drug-drug interaction:  Amiodarone  CBC: Hbg and Plt WNL and stable.  Goal of Therapy:  INR 2-3   Plan:   Continue to hold Warfarin  Daily PT/INR  Monitor CBC, signs of bleed  Gretta Arab PharmD, BCPS Pager [phone] 04/29/2014 8:37 AM

## 2014-04-29 NOTE — Progress Notes (Signed)
Spoke with primary RN, [name] regarding PICC insertion request.   Pt with SVC syndrome and a clot in SVC and RA per MD notes.   Advised RN that that SVC syndrome is a contraindication for bedside PICC insertion.

## 2014-04-29 NOTE — Procedures (Signed)
Central Venous Catheter Insertion Procedure Note CHAI ROUTH [id number] [birth year]  Procedure: Insertion of Central Venous Catheter Indications: Drug and/or fluid administration and Frequent blood sampling  Procedure Details Consent: Risks of procedure as well as the alternatives and risks of each were explained to the (patient/caregiver).  Consent for procedure obtained. Time Out: Verified patient identification, verified procedure, site/side was marked, verified correct patient position, special equipment/implants available, medications/allergies/relevent history reviewed, required imaging and test results available.  Performed Real time [place] used to Id and cannulate the vessel  Maximum sterile technique was used including antiseptics, cap, gloves, gown, hand hygiene, mask and sheet. Skin prep: Chlorhexidine; local anesthetic administered A antimicrobial bonded/coated triple lumen catheter was placed in the right femoral vein due to SVC syndrome Evaluation Blood flow good Complications: No apparent complications Patient did tolerate procedure well. Chest X-ray ordered to verify placement.  CXR: pending.  BABCOCK,PETE 04/29/2014, 3:12 PM  Baltazar Apo, MD, PhD 05/12/2014, 11:47 AM [facility] [phone] or if no answer [phone]

## 2014-04-29 NOTE — Progress Notes (Signed)
INITIAL NUTRITION ASSESSMENT  DOCUMENTATION CODES Per approved criteria  -Severe malnutrition in the context of chronic illness   INTERVENTION: Ensure Complete po BID, each supplement provides 350 kcal and 13 grams of protein PO diet as tolerated RD to monitor.  NUTRITION DIAGNOSIS: Inadequate oral intake related to respiratory failure as evidenced by observation.   Goal: Intake of meals and supplements to meet >90% estimated needs.  Monitor:  Intake, labs, weight trend, goals of care  Reason for Assessment: Length of Stay  73 y.o. male  Admitting Dx: <principal problem not specified>  ASSESSMENT: Patient with acute respiratory distress in setting of pulmonary edema, small cell lung cancer.  Leucocytosis, hyponatremia and possible pneumonia.  Patient did not tolerate bipap and discussion underway regarding vent as patient may not be easy to get off once intubated.  S/p emergent pericardial window and bronchoscopy with biopsy 7/2.  Son in room Good intake prior to admit, fair after procedure and very poor for the past 3 days. Prior to admit, ate one big meal. Decreased urine output. Patient meets criteria for severe malnutrition related to chronic illness AEB decreased body fat and muscle mass.  Nutrition Focused Physical Exam:  Subcutaneous Fat:  Orbital Region: mild Upper Arm Region: severe Thoracic and Lumbar Region: moderate  Muscle:  Temple Region: moderate Clavicle Bone Region: mild Clavicle and Acromion Bone Region: moderate Scapular Bone Region: moderate Dorsal Hand: mild Patellar Region: n/a Anterior Thigh Region: n/a Posterior Calf Region: n/a  Edema: yes    Height: Ht Readings from Last 1 Encounters:  04/27/14 5\' 10"  (1.778 m)    Weight: Wt Readings from Last 1 Encounters:  04/29/14 177 lb 7.5 oz (80.5 kg)    Ideal Body Weight: 166 lbs  % Ideal Body Weight: 107  Wt Readings from Last 10 Encounters:  04/29/14 177 lb 7.5 oz (80.5 kg)   04/29/14 177 lb 7.5 oz (80.5 kg)  04/29/14 177 lb 7.5 oz (80.5 kg)  04/17/14 170 lb (77.111 kg)    Usual Body Weight: 170 lbs  % Usual Body Weight: 104 fluid  BMI:  Body mass index is 25.46 kg/(m^2).  Estimated Nutritional Needs: Kcal: 1950-2150 Protein: 95-105 gm Fluid: 2L  Skin: intact  Diet Order: General  EDUCATION NEEDS: -Education needs addressed   Intake/Output Summary (Last 24 hours) at 04/29/14 1326 Last data filed at 04/29/14 1300  Gross per 24 hour  Intake 1216.55 ml  Output    290 ml  Net 926.55 ml    Labs:   Recent Labs Lab 04/27/14 0025 04/28/14 0355 04/29/14 0356  NA 134* 134* 135*  K 4.4 4.8 5.1  CL 94* 94* 94*  CO2 21 22 23   BUN 34* 41* 47*  CREATININE 0.91 0.95 1.02  CALCIUM 9.1 9.0 9.1  MG 2.5  --   --   GLUCOSE 138* 151* 156*    CBG (last 3)  No results found for this basename: GLUCAP,  in the last 72 hours  Scheduled Meds: . antiseptic oral rinse  15 mL Mouth Rinse BID  . bisacodyl  10 mg Oral Daily  . bisoprolol  2.5 mg Oral Daily  . coumadin book   Does not apply Once  . [START ON 09-May-2014] digoxin  0.125 mg Oral Daily  . digoxin  0.25 mg Oral Once  . docusate sodium  100 mg Oral BID  . feeding supplement (ENSURE COMPLETE)  237 mL Oral TID BM  . montelukast  10 mg Oral q morning - 10a  .  pantoprazole  40 mg Oral Daily  . piperacillin-tazobactam (ZOSYN)  IV  3.375 g Intravenous Q8H  . piperacillin-tazobactam (ZOSYN)  IV  3.375 g Intravenous 3 times per day  . senna-docusate  1 tablet Oral QHS  . Warfarin - Pharmacist Dosing Inpatient   Does not apply q1800    Continuous Infusions: . amiodarone 60 mg/hr (04/29/14 0930)   Followed by  . amiodarone      Past Medical History  Diagnosis Date  . Hypertension   . Hypercholesteremia   . Seasonal allergies     Past Surgical History  Procedure Laterality Date  . Wrist fracture surgery  2005  . Video bronchoscopy Bilateral 04/20/2014    Procedure: VIDEO BRONCHOSCOPY  WITHOUT FLUORO;  Surgeon: Tanda Rockers, MD;  Location: [facility];  Service: Cardiopulmonary;  Laterality: Bilateral;  . Tonsillectomy    . Video bronchoscopy  05/16/2014    Procedure: VIDEO BRONCHOSCOPY;  Surgeon: Ivin Poot, MD;  Location: [place];  Service: Thoracic;;  . Subxyphoid pericardial window N/A 04/28/2014    Procedure: SUBXYPHOID PERICARDIAL WINDOW;  Surgeon: Ivin Poot, MD;  Location: [place];  Service: Thoracic;  Laterality: N/A;  . Intraoperative transesophageal echocardiogram N/A 05/02/2014    Procedure: INTRAOPERATIVE TRANSESOPHAGEAL ECHOCARDIOGRAM;  Surgeon: Ivin Poot, MD;  Location: [place];  Service: Open Heart Surgery;  Laterality: N/A;    Antonieta Iba, RD, LDN Clinical Inpatient Dietitian Pager:  [phone] Weekend and after hours pager:  [phone]

## 2014-04-29 NOTE — Progress Notes (Signed)
[facility] PROGRESS NOTE   Dillon Willis JOA:[id number] DOB: 1941/02/01 DOA: 05/09/2014 PCP: Marco Collie, MD  HPI/Subjective: Patient seen earlier today, complaining about ongoing shortness of breath. Heart rate went up to 130s and back on atrial fibrillation. Blood pressure in the low side, later systolic blood pressure went down to the 70s, PCCM reconsulted. Later in the day, patient intubated, he will be in the PCCM service, when he gets to the floor [facility] will be more than happy to assume care again.  Assessment/Plan: Principal Problem:   Acute respiratory failure Active Problems:   Small cell lung cancer   Pericardial effusion with cardiac tamponade   SVC syndrome   Atrial fibrillation   Protein-calorie malnutrition, severe    Atrial fibrillation with RVR -In and out of atrial fibrillation with RVR since 04/20/2014. -Difficult to control with soft blood pressure limiting the options for rate control. -Patient had pericardial tamponade and pericardial window. -Currently on amiodarone drip, digoxin added, switch to oral amiodarone and digoxin.  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 -Seen by medical radiation oncology, started on palliative radiation. -Per oncology chemotherapy will be started after his functional status improves and has heart rate controlled.  SVC syndrome -Secondary to the small cell lung cancer, patient is receiving palliative radiation. -Secondary to both pressure from lymph nodes and SVC thrombosis. -Started on palliative radiation. Anticoagulation continued.  Cardiac tamponade -Patient has history of pericardial effusion which turned into cardiac tamponade on 04/25/2014. -Had emergent pericardial window and bronchoscopy done by Dr. Nils Pyle on 7/2.  SVC/RA thrombosis -Patient started on anticoagulation, INR is therapeutic.  Code Status: Full code Family Communication:  Plan discussed with the patient. Disposition Plan: Remains inpatient   Consultants:  Cards.  Medical Oncology.  Radiation Oncology  Procedures:  Radiation  Antibiotics:  None   Objective: Filed Vitals:   04/29/14 1545  BP: 79/62  Pulse: 63  Temp:   Resp: 17    Intake/Output Summary (Last 24 hours) at 04/29/14 1626 Last data filed at 04/29/14 1400  Gross per 24 hour  Intake 1349.85 ml  Output    290 ml  Net 1059.85 ml   Filed Weights   04/28/14 0400 04/29/14 0400 04/29/14 0500  Weight: 80.5 kg (177 lb 7.5 oz) 81.7 kg (180 lb 1.9 oz) 80.5 kg (177 lb 7.5 oz)    Exam: General: Alert and awake, oriented x3, not in any acute distress. HEENT: anicteric sclera, pupils reactive to light and accommodation, EOMI CVS: S1-S2 clear, no murmur rubs or gallops Chest: clear to auscultation bilaterally, no wheezing, rales or rhonchi Abdomen: soft nontender, nondistended, normal bowel sounds, no organomegaly Extremities: no cyanosis, clubbing or edema noted bilaterally Neuro: Cranial nerves II-XII intact, no focal neurological deficits  Data Reviewed: Basic Metabolic Panel:  Recent Labs Lab 04/24/14 0347 04/25/14 0422 04/27/14 0025 04/28/14 0355 04/29/14 0356  NA 132* 132* 134* 134* 135*  K 3.9 4.4 4.4 4.8 5.1  CL 94* 93* 94* 94* 94*  CO2 20 23 21 22 23   GLUCOSE 99 115* 138* 151* 156*  BUN 17 21 34* 41* 47*  CREATININE 0.71 0.87 0.91 0.95 1.02  CALCIUM 8.6 9.0 9.1 9.0 9.1  MG  --   --  2.5  --   --    Liver Function Tests: No results found for this basename: AST, ALT, ALKPHOS, BILITOT, PROT, ALBUMIN,  in the last 168 hours No results found for this basename: LIPASE, AMYLASE,  in the last 168 hours No results found for this basename: AMMONIA,  in the last 168 hours CBC:  Recent Labs Lab 04/26/14 0655 04/27/14 0025 04/28/14 0355 04/29/14 0356 04/29/14 1550  WBC 16.2* 17.1* 18.6* 21.1* 29.4*  NEUTROABS  --   --   --   --  27.1*  HGB 11.7* 11.8* 12.2*  13.0 12.5*  HCT 35.5* 36.2* 37.7* 40.3 38.2*  MCV 94.7 94.8 96.2 97.6 98.5  PLT 327 324 297 303 239   Cardiac Enzymes: No results found for this basename: CKTOTAL, CKMB, CKMBINDEX, TROPONINI,  in the last 168 hours BNP (last 3 results)  Recent Labs  04/17/14 1715 05/03/2014 1030  PROBNP 1390.0* 1408.0*   CBG:  Recent Labs Lab 04/23/14 1605 04/23/14 2128 04/24/14 0600 04/25/14 1118 04/26/14 1118  GLUCAP 126* 96 101* 125* 141*    Micro No results found for this or any previous visit (from the past 240 hour(s)).   Studies: Dg Chest 1 View  04/29/2014   CLINICAL DATA:  Shortness of breath, hypertension  EXAM: CHEST - 1 VIEW  COMPARISON:  Portable exam 1112 hr compared to 04/24/2014  FINDINGS: Enlargement of cardiac silhouette with pulmonary vascular congestion.  Calcified RIGHT hilar adenopathy with calcified granuloma lower RIGHT chest.  Bibasilar effusions and atelectasis.  Question minimal perihilar edema.  Slightly improved aeration in RIGHT upper lobe.  No pneumothorax.  IMPRESSION: Enlargement of cardiac silhouette with pulmonary vascular congestion and question minimal edema.  Old granulomatous disease with persistent bibasilar effusions and atelectasis.  Improving atelectasis RIGHT upper lobe.   Electronically Signed   By: Lavonia Dana M.D.   On: 04/29/2014 11:33   Portable Chest Xray  04/29/2014   CLINICAL DATA:  Endotracheal tube placement.  EXAM: PORTABLE CHEST - 1 VIEW 3:15 p.m.  COMPARISON:  Chest x-ray dated 04/29/2014  at 11:12 a.m.  FINDINGS: Endotracheal tube is in good position at the T3-4 level. Again noted are the bilateral pleural effusions, enlargement of the cardiac silhouette and the large right mediastinal and perihilar mass and collapse and consolidation of the right upper lobe, unchanged.  Calcified granuloma at the right lung base is unchanged.  IMPRESSION: Endotracheal tube in good position. No change in the remainder of the chest. Bilateral pleural effusions  with right hilar and mediastinal mass and right upper lobe consolidation and atelectasis.   Electronically Signed   By: Rozetta Nunnery M.D.   On: 04/29/2014 15:32    Scheduled Meds: . antiseptic oral rinse  15 mL Mouth Rinse BID  . bisoprolol  2.5 mg Oral Daily  . coumadin book   Does not apply Once  . [START ON 2014/05/25] digoxin  0.125 mg Per Tube Daily  . feeding supplement (ENSURE COMPLETE)  237 mL Oral TID BM  . fentaNYL      . lidocaine (cardiac) 100 mg/56ml      . midazolam      . [START ON 05-25-14] pantoprazole (PROTONIX) IV  40 mg Intravenous Daily  . piperacillin-tazobactam (ZOSYN)  IV  3.375 g Intravenous Q8H  . piperacillin-tazobactam (ZOSYN)  IV  3.375 g Intravenous 3 times per day  . rocuronium      . succinylcholine      . [START ON 05-25-2014] vancomycin  1,000 mg Intravenous Q12H  . Warfarin - Pharmacist Dosing Inpatient   Does not apply q1800   Continuous Infusions: . sodium chloride    . amiodarone Stopped (04/29/14 1430)  . norepinephrine (LEVOPHED) Adult infusion  Time spent: 35 minutes    [facility]  [facility] Pager [phone] If 7PM-7AM, please contact night-coverage at [email or web address], password [facility] 04/29/2014, 4:26 PM  LOS: 11 days

## 2014-04-29 NOTE — Progress Notes (Signed)
Patient Name: Dillon Willis Date of Encounter: 04/29/2014  Active Problems:   Small cell lung cancer   Pericardial effusion with cardiac tamponade   SVC syndrome   Atrial fibrillation   Length of Stay: 11  SUBJECTIVE  Went into AF with RVR 45 minutes ago, as before he feels very poorly while in it.  CURRENT MEDS . amiodarone  400 mg Oral TID  . antiseptic oral rinse  15 mL Mouth Rinse BID  . bisacodyl  10 mg Oral Daily  . bisoprolol  2.5 mg Oral Daily  . coumadin book   Does not apply Once  . [START ON 05-02-14] digoxin  0.125 mg Oral Daily  . digoxin  0.25 mg Oral Once  . digoxin  0.25 mg Oral Once  . digoxin  0.25 mg Oral Once  . docusate sodium  100 mg Oral BID  . montelukast  10 mg Oral q morning - 10a  . pantoprazole  40 mg Oral Daily  . senna-docusate  1 tablet Oral QHS  . Warfarin - Pharmacist Dosing Inpatient   Does not apply q1800    OBJECTIVE   Intake/Output Summary (Last 24 hours) at 04/29/14 0847 Last data filed at 04/29/14 0600  Gross per 24 hour  Intake 1133.4 ml  Output    400 ml  Net  733.4 ml   Filed Weights   04/28/14 0400 04/29/14 0400 04/29/14 0500  Weight: 177 lb 7.5 oz (80.5 kg) 180 lb 1.9 oz (81.7 kg) 177 lb 7.5 oz (80.5 kg)    PHYSICAL EXAM Filed Vitals:   04/29/14 0300 04/29/14 0400 04/29/14 0500 04/29/14 0600  BP: 93/65 98/70 89/62  92/71  Pulse: 69 75 71 73  Temp:      TempSrc:      Resp: 16 26 18 23   Height:      Weight:  180 lb 1.9 oz (81.7 kg) 177 lb 7.5 oz (80.5 kg)   SpO2: 97% 96% 98% 94%   General: Alert, oriented x3, no distress Head: no evidence of trauma, PERRL, EOMI, no exophtalmos or lid lag, no myxedema, no xanthelasma; normal ears, nose and oropharynx Neck: normal jugular venous pulsations and no hepatojugular reflux; brisk carotid pulses without delay and no carotid bruits Chest: egophony and increased fremitus in both bases Cardiovascular: normal position and quality of the apical impulse, rapid irregular  rhythm, normal first and second heart sounds, no rubs or gallops, no murmur Abdomen: no tenderness or distention, no masses by palpation, no abnormal pulsatility or arterial bruits, normal bowel sounds, no hepatosplenomegaly Extremities: no clubbing, cyanosis or edema; 2+ radial, ulnar and brachial pulses bilaterally; 2+ right femoral, posterior tibial and dorsalis pedis pulses; 2+ left femoral, posterior tibial and dorsalis pedis pulses; no subclavian or femoral bruits Neurological: grossly nonfocal  LABS  CBC  Recent Labs  04/28/14 0355 04/29/14 0356  WBC 18.6* 21.1*  HGB 12.2* 13.0  HCT 37.7* 40.3  MCV 96.2 97.6  PLT 297 950   Basic Metabolic Panel  Recent Labs  04/27/14 0025 04/28/14 0355 04/29/14 0356  NA 134* 134* 135*  K 4.4 4.8 5.1  CL 94* 94* 94*  CO2 21 22 23   GLUCOSE 138* 151* 156*  BUN 34* 41* 47*  CREATININE 0.91 0.95 1.02  CALCIUM 9.1 9.0 9.1  MG 2.5  --   --     Radiology Studies Imaging results have been reviewed and No results found.  TELE AF w RVR 120s  ASSESSMENT AND PLAN Restart IV amio  Load digoxin orally Still hope to gradually see a reduction in AF burden - if we don't, it probably means he has extensive pericardial cancer involvement. Prognosis is very guarded. May need to consider changing the overall approach towards palliative care if we cannot start chemo this week.   Sanda Klein, MD, [facility] [phone] office [phone] pager 04/29/2014 8:47 AM

## 2014-04-29 NOTE — Progress Notes (Signed)
Amiodarone Drug - Drug Interaction Consult Note  Recommendations:  Amiodarone is metabolized by the cytochrome P450 system and therefore has the potential to cause many drug interactions. Amiodarone has an average plasma half-life of 50 days (range 20 to 100 days).   There is potential for drug interactions to occur several weeks or months after stopping treatment and the onset of drug interactions may be slow after initiating amiodarone.    [x]  Statins: Increased risk of myopathy. Simvastatin- restrict dose to 20mg  daily.  Counsel patients to report any muscle pain or  weakness immediately.  --- Simvastatin 20mg  was taken PTA, but was held on 7/5.  [x]  Anticoagulants: Amiodarone can increase anticoagulant effect. Consider warfarin dose reduction. Patients should be monitored closely and the dose of anticoagulant altered accordingly, remembering that amiodarone levels take several weeks to stabilize.  --- INR is supratherapeutic and increasing.  Warfarin dosing per pharmacy and remains on hold.  []  Antiepileptics: Amiodarone can increase plasma concentration of phenytoin, the dose should be reduced. Note that small changes in phenytoin dose can result in large changes in levels. Monitor patient and counsel on signs of toxicity.  [x]  Beta blockers: increased risk of bradycardia, AV block and myocardial depression. Avoid concomitant use with Sotalol.  ---  Bisoprolol started on 7/11.  []   Calcium channel blockers (diltiazem and verapamil): increased risk of bradycardia, AV block and myocardial depression.  []   Cyclosporine: Amiodarone increases levels of cyclosporine. Reduced dose of cyclosporine is recommended.  [x]  Digoxin dose should be halved when amiodarone is started.  ---  Requiring repeat loading doses on 7/13 and daily dose starting on 7/14.  []  Diuretics: increased risk of cardiotoxicity if hypokalemia occurs.  []  Oral hypoglycemic agents (glyburide, glipizide, glimepiride):  increased risk of hypoglycemia. Patient's glucose levels should be monitored closely when initiating amiodarone therapy.   []  Drugs that prolong the QT interval:  Torsades de pointes risk may be increased with concurrent use - avoid if possible.  Monitor QTc, also keep magnesium/potassium WNL if concurrent therapy can't be avoided. Marland Kitchen Antibiotics: e.g. fluoroquinolones, erythromycin. . Antiarrhythmics: e.g. quinidine, procainamide, disopyramide, sotalol. . Antipsychotics: e.g. phenothiazines, haloperidol.  . Lithium, tricyclic antidepressants, and methadone.   Thank Dennis Bast  Gretta Arab PharmD, BCPS Pager [phone] 04/29/2014 9:30 AM

## 2014-04-29 NOTE — Progress Notes (Signed)
Provided support with pt's sons around intubation.  Pt's son spoke with chaplain about his history of substance use and coping with loss / grief while sober.    [place], [place]

## 2014-04-29 NOTE — Progress Notes (Signed)
[place], RN, BSN, CCM  [phone]  Chart Reviewed for discharge and hospital needs.  Discharge needs at time of review: None present will follow for needs.  Review of patient progress due on [id number]

## 2014-04-29 NOTE — Progress Notes (Signed)
RT attempted to place PT on BiPAP- PT unable to tolerate pressure as low as 5cm- MD and RN aware. RT placed PT on 55% VM.

## 2014-04-29 NOTE — Progress Notes (Addendum)
Increased Levo to 10 mcq

## 2014-04-29 NOTE — Progress Notes (Signed)
ANTIBIOTIC CONSULT NOTE - INITIAL  Pharmacy Consult for Vancomycin, Zosyn Indication: pneumonia  No Known Allergies  Patient Measurements: Height: 5\' 10"  (177.8 cm) Weight: 177 lb 7.5 oz (80.5 kg) IBW/kg (Calculated) : 73  Vital Signs: BP: 76/50 mmHg (07/13 1200) Pulse Rate: 125 (07/13 1200) Intake/Output from previous day: 07/12 0701 - 07/13 0700 In: 1250.1 [I.V.:1250.1] Out: 400 [Urine:400]  Labs:  Recent Labs  04/27/14 0025 04/28/14 0355 04/29/14 0356  WBC 17.1* 18.6* 21.1*  HGB 11.8* 12.2* 13.0  PLT 324 297 303  CREATININE 0.91 0.95 1.02   Estimated Creatinine Clearance: 67.6 ml/min (by C-G formula based on Cr of 1.02).  Microbiology: Recent Results (from the past 720 hour(s))  MRSA PCR SCREENING     Status: None   Collection Time    05/07/2014 10:17 AM      Result Value Ref Range Status   MRSA by PCR NEGATIVE  NEGATIVE Final   Comment:            The GeneXpert MRSA Assay (FDA     approved for NASAL specimens     only), is one component of a     comprehensive MRSA colonization     surveillance program. It is not     intended to diagnose MRSA     infection nor to guide or     monitor treatment for     MRSA infections.  BODY FLUID CULTURE     Status: None   Collection Time    04/25/2014  4:11 PM      Result Value Ref Range Status   Specimen Description FLUID PERICARDIAL   Final   Special Requests 50ML FLUID   Final   Gram Stain     Final   Value: WBC PRESENT,BOTH PMN AND MONONUCLEAR     NO ORGANISMS SEEN     Performed at [facility]   Culture     Final   Value: NO GROWTH 3 DAYS     Performed at [facility]   Report Status 04/21/2014 FINAL   Final  TISSUE CULTURE     Status: None   Collection Time    05/13/2014  5:29 PM      Result Value Ref Range Status   Specimen Description TISSUE PERICARDIUM   Final   Special Requests NONE   Final   Gram Stain     Final   Value: RARE WBC PRESENT, PREDOMINANTLY PMN     NO ORGANISMS SEEN   Performed at [facility]   Culture     Final   Value: NO GROWTH 3 DAYS     Performed at [facility]   Report Status 04/22/2014 FINAL   Final    Medical History: Past Medical History  Diagnosis Date  . Hypertension   . Hypercholesteremia   . Seasonal allergies     Anti-infectives:  7/2 >> Cefuroxime >> 7/3 7/13 >> Vanc >>  7/13 >> Zosyn >>  [name] admitted on 7/2 with severe SOB and dizziness d/t newly diagnosed RUL lung mass, pericardial effusion.  He was found to have cardiac tamponade and required emergent pericardial window and bronchoscopy with biopsy was performed on 7/3.  He received peri-op cefuroxime x3 doses on 7/2-7/3, but no further antibiotics.  Pharmacy is consulted 7/13 to dose vancomycin and Zosyn for suspected pneumonia.  Tmax: afebrile  WBCs: increasing, 21.1  Renal: SCr 1.02, CrCl ~ 67 ml/min   Goal of Therapy:  Vancomycin trough level 15-20 mcg/ml Appropriate abx dosing, eradication of infection.   Plan:   Zosyn 3.375g IV Q8H infused over 4hrs.  Vancomycin 1g IV q12h.  Measure Vanc trough at steady state.  Follow up renal fxn and culture results.   Dillon Willis PharmD, BCPS Pager [phone] 04/29/2014 1:16 PM

## 2014-04-29 NOTE — Progress Notes (Signed)
Increased Levo 10 mcq

## 2014-04-29 NOTE — Progress Notes (Signed)
PT Cancellation Note  Patient Details Name: Dillon Willis MRN: [id number] DOB: 09-Apr-1941   Cancelled Treatment:    Reason Eval/Treat Not Completed: Medical issues which prohibited therapy (increased HR and INR)   Claretha Cooper 04/29/2014, 11:14 AM Tresa Endo PT [phone]

## 2014-04-30 ENCOUNTER — Inpatient Hospital Stay (HOSPITAL_COMMUNITY): Payer: Medicare Other

## 2014-04-30 LAB — CBC
HCT: 44.5 % (ref 39.0–52.0)
Hemoglobin: 13.7 g/dL (ref 13.0–17.0)
MCH: 31.6 pg (ref 26.0–34.0)
MCHC: 30.8 g/dL (ref 30.0–36.0)
MCV: 102.5 fL — ABNORMAL HIGH (ref 78.0–100.0)
PLATELETS: 243 10*3/uL (ref 150–400)
RBC: 4.34 MIL/uL (ref 4.22–5.81)
RDW: 15.6 % — AB (ref 11.5–15.5)
WBC: 35.6 10*3/uL — AB (ref 4.0–10.5)

## 2014-04-30 LAB — BASIC METABOLIC PANEL
Anion gap: 24 — ABNORMAL HIGH (ref 5–15)
BUN: 61 mg/dL — ABNORMAL HIGH (ref 6–23)
CALCIUM: 8.6 mg/dL (ref 8.4–10.5)
CO2: 16 mEq/L — ABNORMAL LOW (ref 19–32)
Chloride: 94 mEq/L — ABNORMAL LOW (ref 96–112)
Creatinine, Ser: 2.05 mg/dL — ABNORMAL HIGH (ref 0.50–1.35)
GFR, EST AFRICAN AMERICAN: 36 mL/min — AB (ref >90)
GFR, EST NON AFRICAN AMERICAN: 31 mL/min — AB (ref >90)
GLUCOSE: 176 mg/dL — AB (ref 70–99)
POTASSIUM: 6.2 meq/L — AB (ref 3.7–5.3)
SODIUM: 134 meq/L — AB (ref 137–147)

## 2014-04-30 LAB — PREPARE FRESH FROZEN PLASMA
UNIT DIVISION: 0
Unit division: 0

## 2014-04-30 LAB — PROTIME-INR
INR: 7.17 (ref 0.00–1.49)
PROTHROMBIN TIME: 61.5 s — AB (ref 11.6–15.2)

## 2014-04-30 LAB — PRO B NATRIURETIC PEPTIDE: Pro B Natriuretic peptide (BNP): 65453 pg/mL — ABNORMAL HIGH (ref 0–125)

## 2014-05-18 NOTE — Progress Notes (Signed)
CRITICAL VALUE ALERT  Critical value received:  INR 7.17  Date of notification:  May 07, 2014  Time of notification:  06:30  Critical value read back:Yes.    Nurse who received alert:  A. Tamala Julian  MD notified (1st page):  Deter[place]  Time of first page:  06:35  MD notified (2nd page):  Time of second page:  Responding MD:  Deterding  Time MD responded:  06:35

## 2014-05-18 NOTE — Progress Notes (Signed)
Bradycardia in 30's, [name], son, called and made aware.(in building)

## 2014-05-18 NOTE — Progress Notes (Signed)
PT Cancellation Note  Patient Details Name: Dillon Willis MRN: [id number] DOB: August 12, 1941   Cancelled Treatment:    Reason Eval/Treat Not Completed: Medical issues which prohibited therapy (low BP, high INR.)   Claretha Cooper 05-18-14, 7:36 AM Tresa Endo PT [phone]

## 2014-05-18 NOTE — Progress Notes (Signed)
Asystole on monitor, No spontaneous respirations, no heartbeat auscultated. Pronounced by Park Breed RN and Antoine Poche RN. Family at bedside.

## 2014-05-18 DEATH — deceased

## 2014-05-20 ENCOUNTER — Ambulatory Visit: Payer: Medicare Other

## 2014-05-24 NOTE — Discharge Summary (Signed)
PULMONARY / CRITICAL CARE MEDICINE DEATH SUMMARY   Name: Dillon Willis MRN: [id number] DOB: [birth year]    ADMISSION DATE:  2014/05/07 DATE OF DEATH: May 19, 2014   PRIMARY SERVICE: PCCM  PCP : Dr. 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    BRIEF HOSPITAL COURSE: 53 yowm smoker referred to office 04/17/14 for intermittent hemoptysis and RUL lung mass. He underwent CT chest that showed no PE but large R peritracheal mass with associated IVC clot and SVC syndrome. Also noted was an enlarging pericardial effusion. He was admitted with weakness, hypotension, hemoptysis. TTE on 2023/05/08 revealed tamponade physiology and he underwent pericardial window + bronchoscopy with biopsies on 08-May-2023. His anticoagulation was started post-op. Path showed SCLCA. Repeat TTE 7/6 showed improvement in tamponade. His post-procedure course was c/b A fib + RVR. A Ct head 7/8 confirmed enhancing metastatic lesions in the brain. He move d to [facility] to undergo urgent XRT which was started 04/26/14.  He developed recurrent A fib + RVR, B pulmonary infiltrates and acute respiratory distress 7/12-13 requiring transfer to the ICU. His hypotension made empiric diuresis difficult. Empiric broad antibiotics were started to treat possible HCAP.   Discussions with the patient and family were undertaken regarding his goals for care. It was felt that short term aggressive care would be appropriate to see if he could stabilize. He was intubated and central IV access was obtained for pressors support. Despite all of these interventions he continued to rapidly decline with refractory shock and evolving renal failure.  He died 2023/05/20 on full support.   SIGNIFICANT EVENTS / STUDIES:  CTA chest 04/17/14 >neg PE, thrombus within the superior vena cava secondary to mass effect from the large right paratracheal mass. Venous return from the head and upper thorax is via collaterals.  The pericardial effusion has increased and is now moderate in size with maximal thickness of 2.6 cm. The small bilateral pleural effusions have increased.  total right upper lobe collapse. There is subsegmental atelectasis on the left. . There is bilateral adrenal enlargement 2D Echo21-Jul-2015 >>> tamponade. 7/2 emergent pericardial window and bronchoscopy with RUL mass biopsy by Dr. Nils Pyle 2D Echo post  Window > no more tamponade, RA clot, SVC clot, LVEF 50-55% 7/4 AFib with RVR, ICU transfer held 7/7 on floor, remains on amio and heparin drip. 7/8 CT head >>>1. At least 4 enhancing lesions are evident as described. Two scratch the the largest to or in the high anterior left frontal lobe measuring 8 and 9 mm respectively. 2. Minimal posterior right ethmoid sinus disease. 7/9  Afib with RVR again, transfer to [facility] 7/10: status post 1 high-dose fraction of radiotherapy to the chest on 04/26/2014 under the care of Dr. Pablo Ledger.  7/13: developed onset of PAF w/ worsening dyspnea and hypotension. PCCM asked to see again   LINES / TUBES: 2023/05/08 Sub xyphoid pericardial tube >>7/4  CULTURES: 05-08-2023 pericardial fluid >>neg 7/2 pericardial tissue>>>neg  ANTIBIOTICS: 7/2 periop cefuroxime >>off 7/12: vanc>>> 7/12: zosyn>>>   Baltazar Apo, MD, PhD 05/24/2014, 12:25 PM [place] [phone] or if no answer [phone]

## 2014-06-12 ENCOUNTER — Other Ambulatory Visit: Payer: Self-pay | Admitting: *Deleted

## 2016-01-24 IMAGING — CT CT HEAD WO/W CM
1 of 2 series · 13 of 30 positions shown, 17 images · IV contrast (omnipaque)
Comparison: None.

CLINICAL DATA: Lung cancer.

EXAM:
CT HEAD WITHOUT AND WITH CONTRAST
TECHNIQUE: Contiguous axial images were obtained from the base of the skull
through the vertex without and with intravenous contrast
CONTRAST:  80mL OMNIPAQUE IOHEXOL 300 MG/ML  SOLN

[Series 3: head wo 5.0 h30s · axial · 0.45mm/px · z∈[+127,+252]mm · 13 of 31 slices shown, 17 images]
[im 3/31  brain]
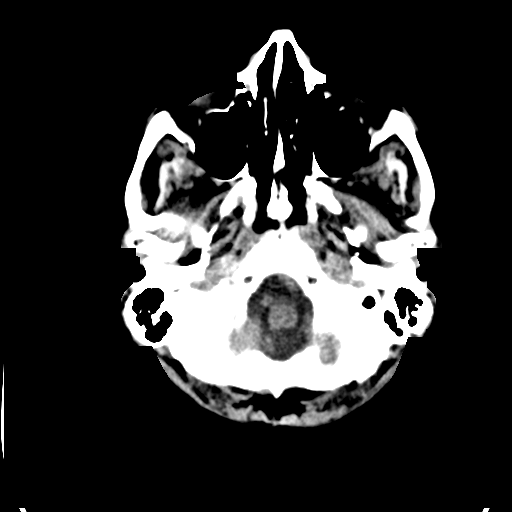
[im 3/31  bone]
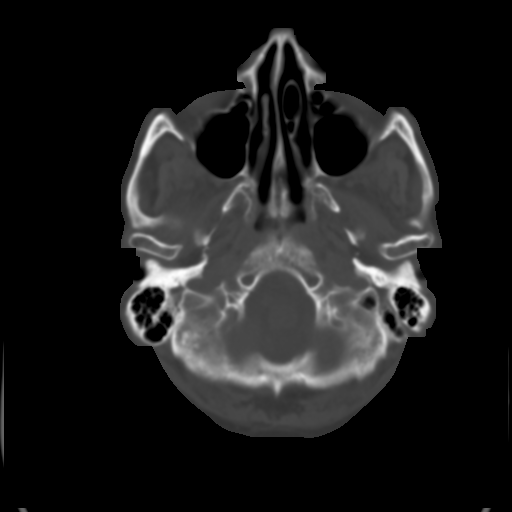
[im 5/31  brain]
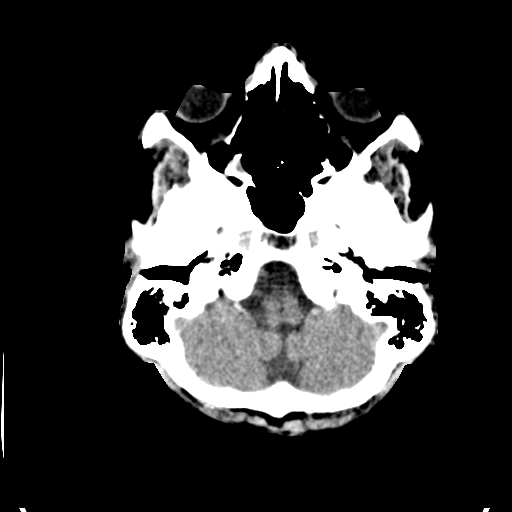
[im 7/31  brain]
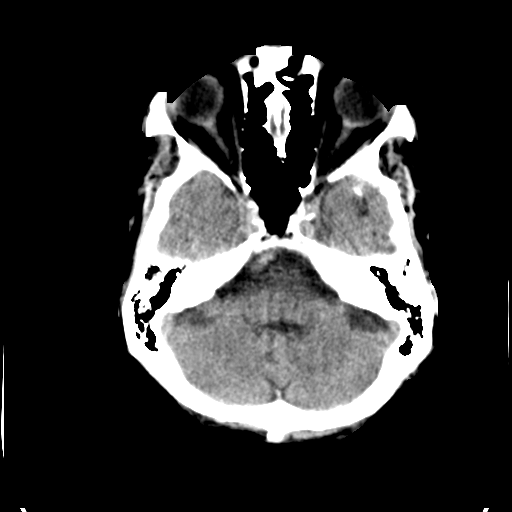
[im 9/31  brain]
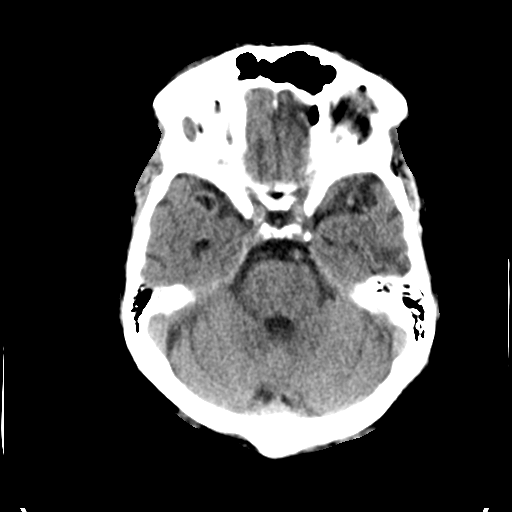
[im 11/31  brain]
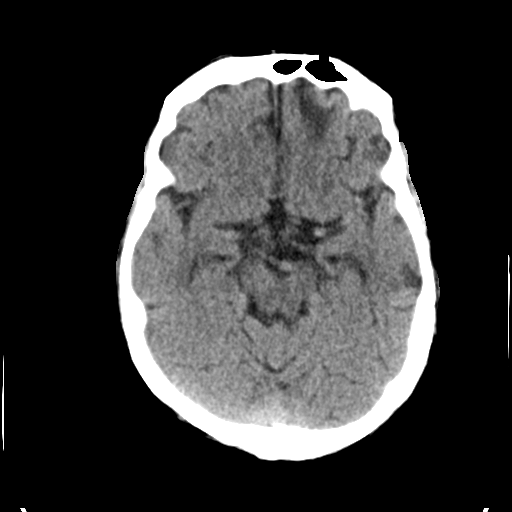
[im 11/31  bone]
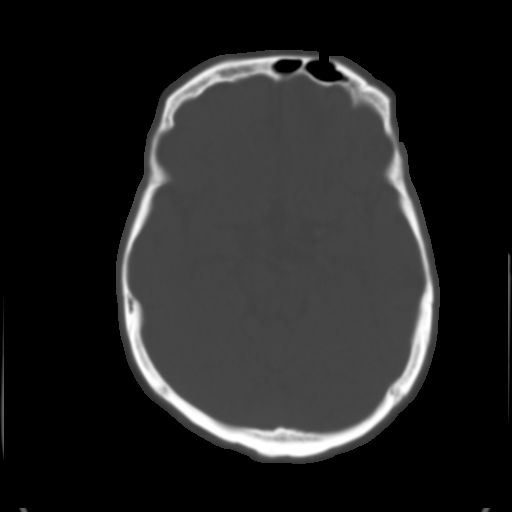
[im 13/31  brain]
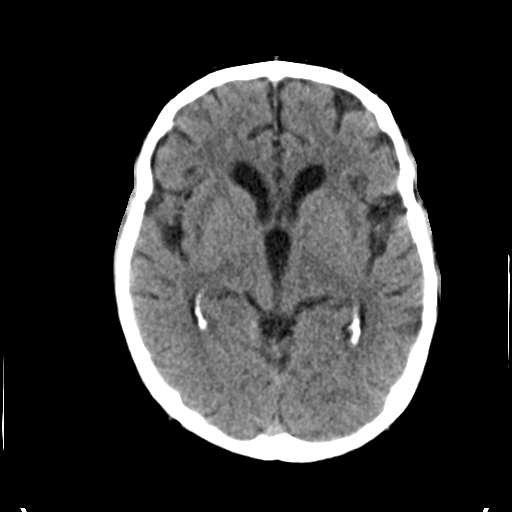
[im 16/31  brain]
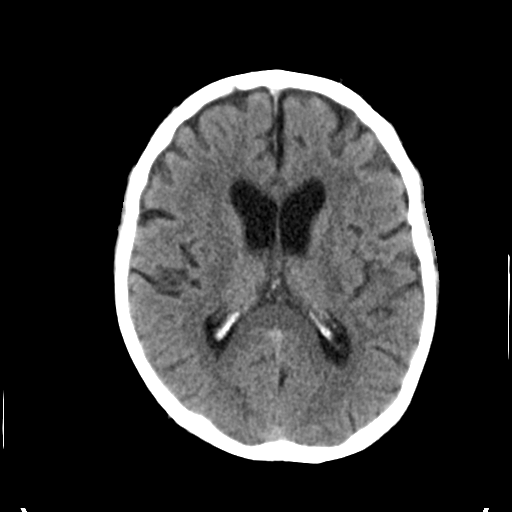
[im 18/31  brain]
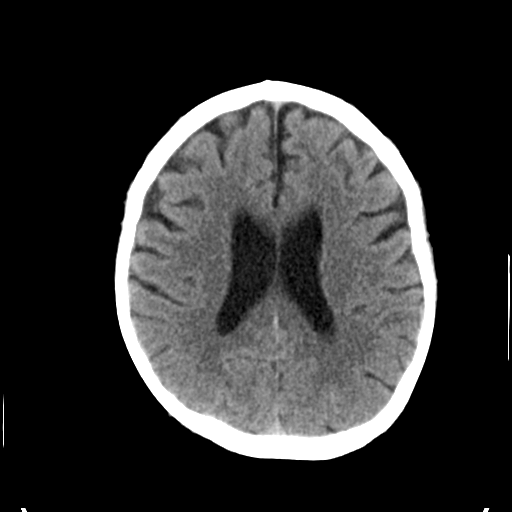
[im 20/31  brain]
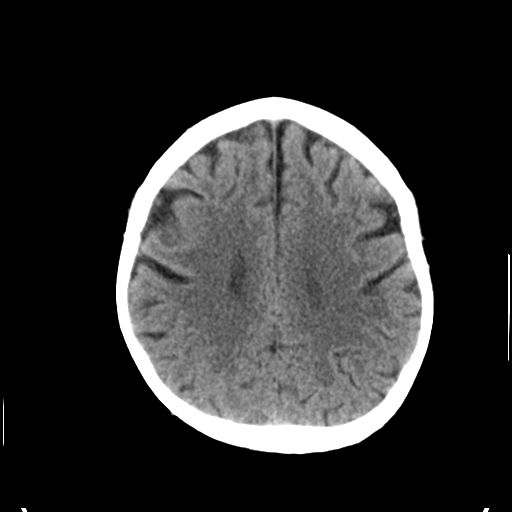
[im 20/31  bone]
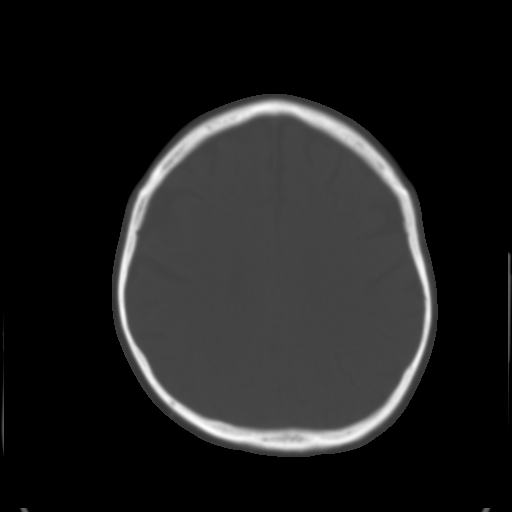
[im 22/31  brain]
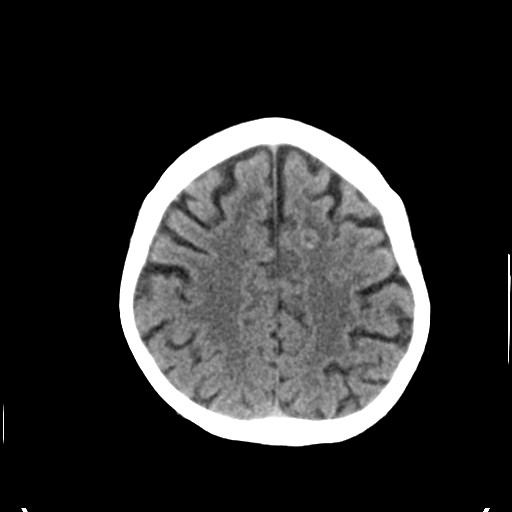
[im 24/31  brain]
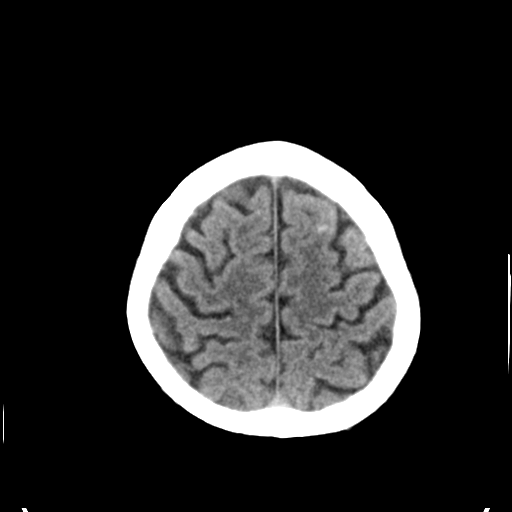
[im 26/31  brain]
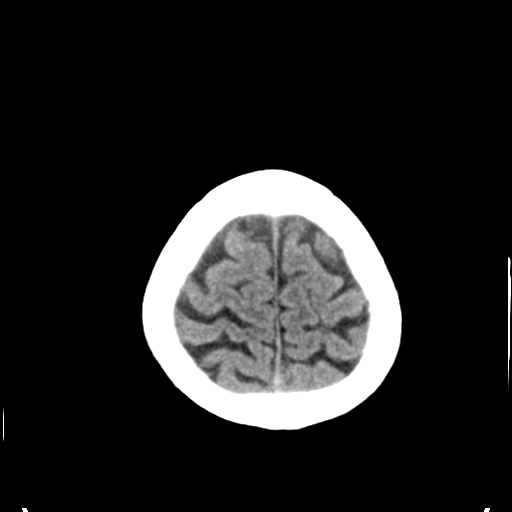
[im 28/31  brain]
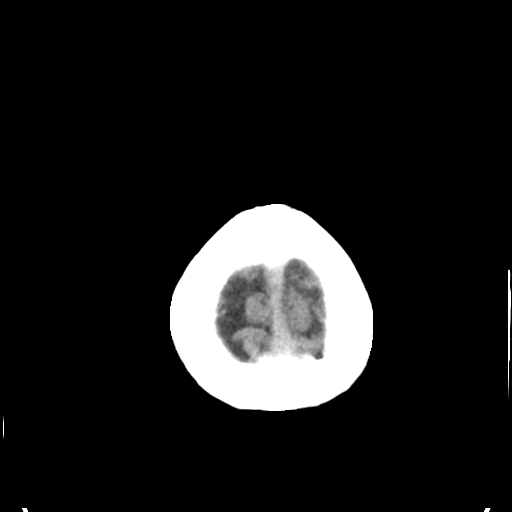
[im 28/31  bone]
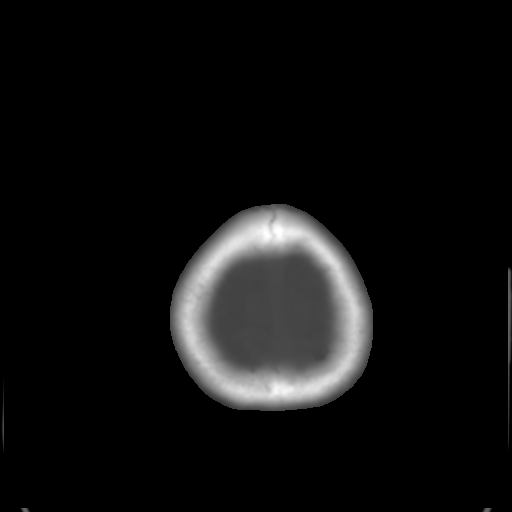

[13 of 30 positions shown; findings below may reference images not displayed]

FINDINGS: Two enhancing high left frontal lobe lesions measure 9 mm on image
22 and 8 mm on image 23 of series 5. A more inferior anterior right
frontal lobe lesion measures 6 mm on image 13. A 3 mm lesion is
present on the same image in the anterior left frontal lobe. No
additional lesions are evident.

No focal hemorrhage or infarct is present. The ventricles are of
normal size. No significant extra-axial fluid collection is present.

A posterior right ethmoid air cell is opacified. The paranasal
sinuses and mastoid air cells are otherwise clear. Atherosclerotic
calcifications are present within the cavernous carotid arteries.
IMPRESSION: 1. At least 4 enhancing lesions are evident as described. Two
scratch the the largest to or in the high anterior left frontal lobe
measuring 8 and 9 mm respectively.
2. Minimal posterior right ethmoid sinus disease.

## 2016-01-29 IMAGING — CR DG ABD PORTABLE 1V
1 series · 1 of 1 positions shown · non-contrast
Comparison: None.

CLINICAL DATA: NG tube placement

EXAM:
PORTABLE ABDOMEN - 1 VIEW

[ap (kub)]
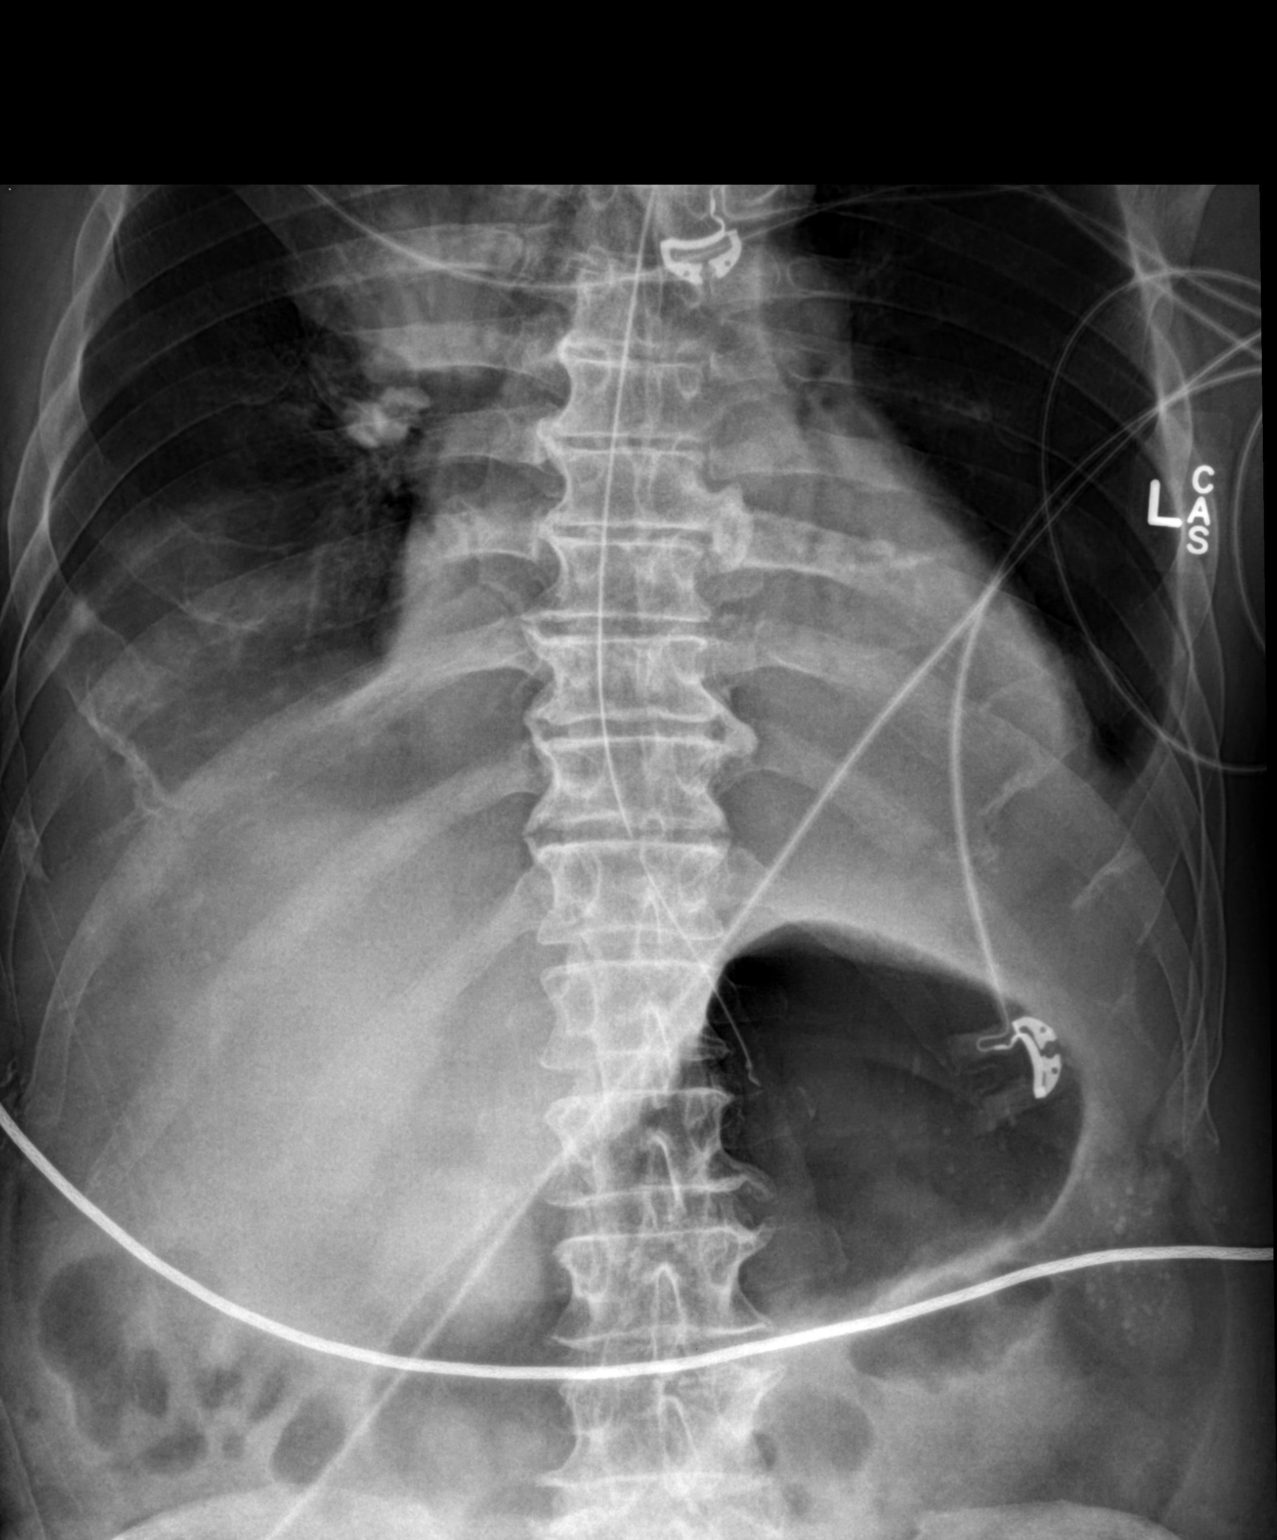

[1 of 1 positions shown; findings below may reference images not displayed]

FINDINGS: NG tube placement with the tip projecting over the stomach, but the
proximal port is just below the gastroesophageal junction. Recommend
advancing the nasogastric tube 10 cm.

There is no bowel dilatation to suggest obstruction. There is no
evidence of pneumoperitoneum, portal venous gas or pneumatosis.
There are no pathologic calcifications along the expected course of
the ureters.The osseous structures are unremarkable.

There is right upper lobe collapse.
IMPRESSION: 1. NG tube placement with the tip projecting over the stomach, but
the proximal port is just below the gastroesophageal junction.
Recommend advancing the nasogastric tube 10 cm.

## 2016-01-29 IMAGING — DX DG CHEST 1V
1 series · 1 of 1 positions shown · non-contrast
Comparison: Portable exam 3336 hr compared to 04/24/2014

CLINICAL DATA: Shortness of breath, hypertension

EXAM:
CHEST - 1 VIEW

[chest ap]
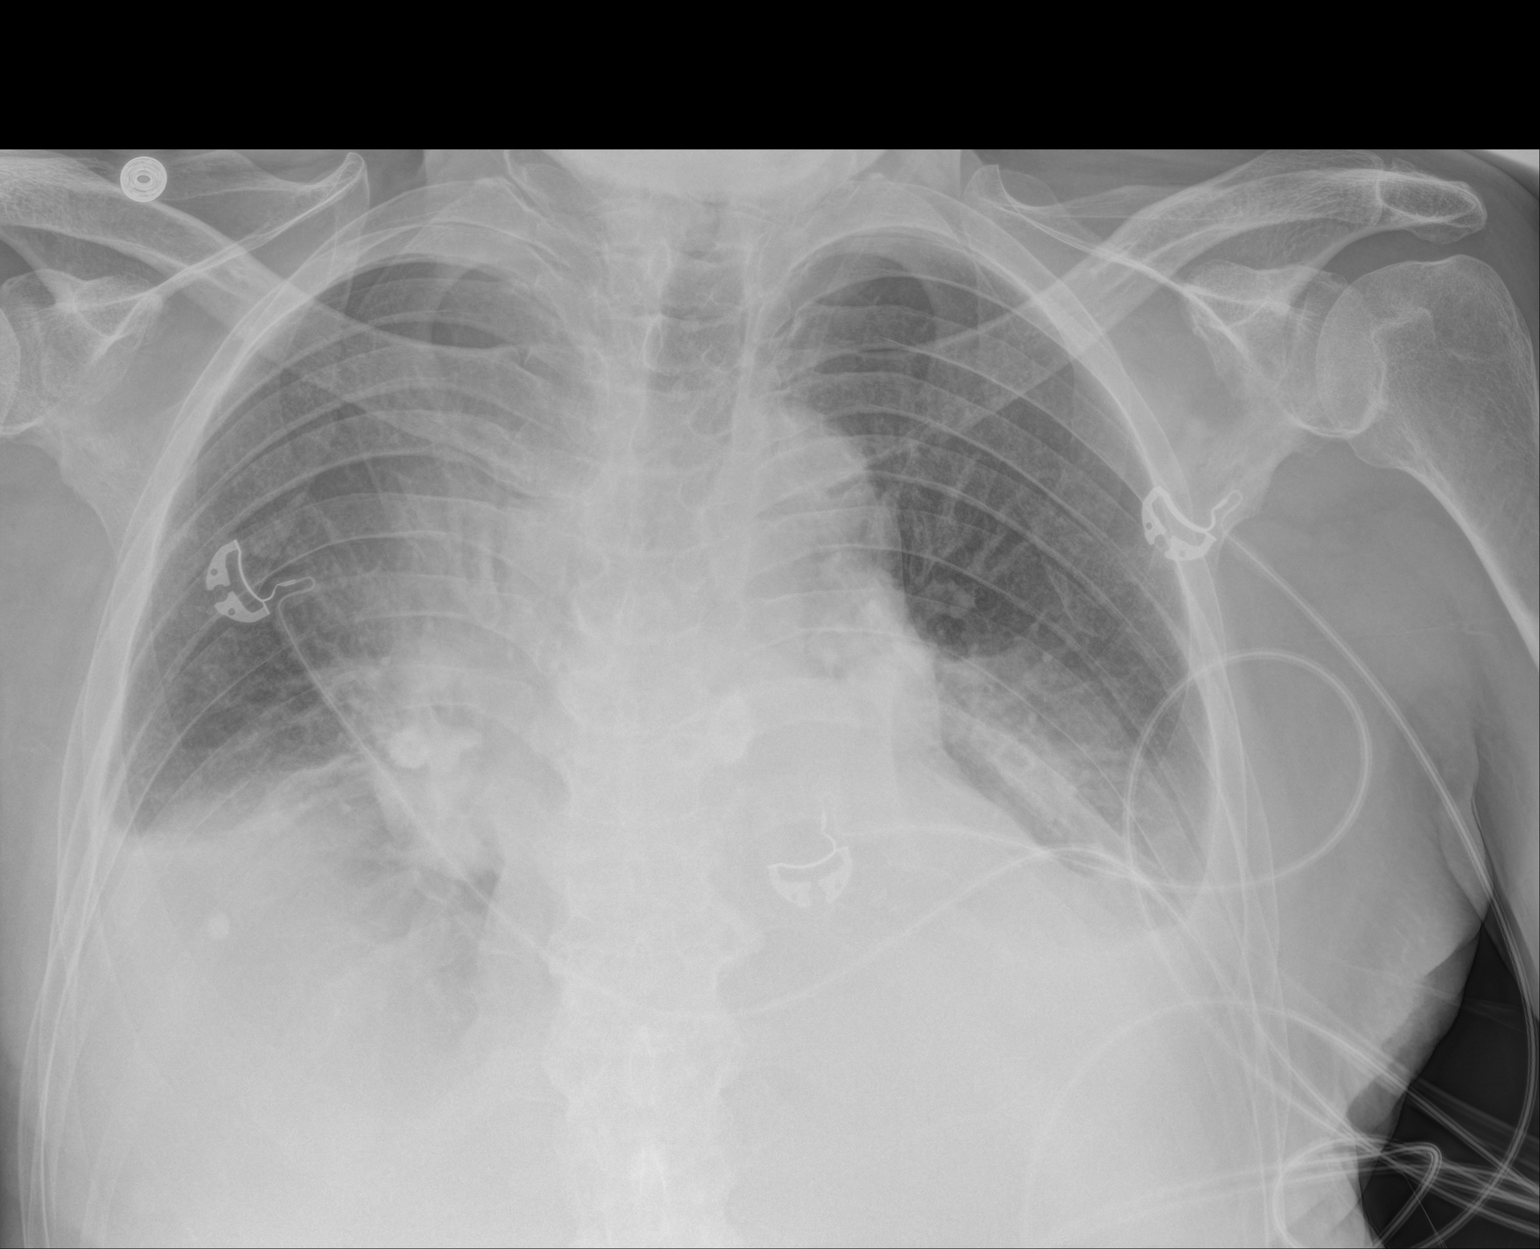

[1 of 1 positions shown; findings below may reference images not displayed]

FINDINGS: Enlargement of cardiac silhouette with pulmonary vascular
congestion.

Calcified RIGHT hilar adenopathy with calcified granuloma lower
RIGHT chest.

Bibasilar effusions and atelectasis.

Question minimal perihilar edema.

Slightly improved aeration in RIGHT upper lobe.

No pneumothorax.
IMPRESSION: Enlargement of cardiac silhouette with pulmonary vascular congestion
and question minimal edema.

Old granulomatous disease with persistent bibasilar effusions and
atelectasis.

Improving atelectasis RIGHT upper lobe.
